# Patient Record
Sex: Female | Born: 1965 | Race: White | Hispanic: No | Marital: Married | Smoking: Never smoker
Health system: Southern US, Community
[De-identification: ages and names within clinical notes are randomized; demographics above are authoritative.]

## PROBLEM LIST (undated history)

## (undated) DIAGNOSIS — E039 Hypothyroidism, unspecified: Secondary | ICD-10-CM

## (undated) DIAGNOSIS — E079 Disorder of thyroid, unspecified: Secondary | ICD-10-CM

## (undated) DIAGNOSIS — C50919 Malignant neoplasm of unspecified site of unspecified female breast: Secondary | ICD-10-CM

## (undated) DIAGNOSIS — K5790 Diverticulosis of intestine, part unspecified, without perforation or abscess without bleeding: Secondary | ICD-10-CM

## (undated) HISTORY — PX: APPENDECTOMY: SHX54

## (undated) HISTORY — DX: Malignant neoplasm of unspecified site of unspecified female breast: C50.919

---

## 1994-08-17 HISTORY — PX: ECTOPIC PREGNANCY SURGERY: SHX613

## 2012-12-19 ENCOUNTER — Encounter (HOSPITAL_COMMUNITY): Payer: Self-pay

## 2012-12-19 ENCOUNTER — Emergency Department (HOSPITAL_COMMUNITY)
Admission: EM | Admit: 2012-12-19 | Discharge: 2012-12-19 | Disposition: A | Discharge status: Home / Self Care | Payer: 59 | Source: Home / Self Care | Attending: Emergency Medicine | Admitting: Emergency Medicine

## 2012-12-19 DIAGNOSIS — J209 Acute bronchitis, unspecified: Secondary | ICD-10-CM

## 2012-12-19 MED ORDER — PREDNISONE 20 MG PO TABS: 20.0000 mg | ORAL_TABLET | Freq: Two times a day (BID) | ORAL | Status: DC

## 2012-12-19 MED ORDER — HYDROCOD POLST-CHLORPHEN POLST 10-8 MG/5ML PO LQCR: 5.0000 mL | Freq: Two times a day (BID) | ORAL | Status: DC | PRN

## 2012-12-19 MED ORDER — AMOXICILLIN-POT CLAVULANATE 875-125 MG PO TABS: 1.0000 | ORAL_TABLET | Freq: Two times a day (BID) | ORAL | Status: DC

## 2012-12-19 MED ORDER — FLUCONAZOLE 150 MG PO TABS
150.0000 mg | ORAL_TABLET | Freq: Once | ORAL | Status: DC
Start: 2012-12-19 — End: 2016-09-10

## 2012-12-19 MED ORDER — ALBUTEROL SULFATE HFA 108 (90 BASE) MCG/ACT IN AERS: 1.0000 | INHALATION_SPRAY | Freq: Four times a day (QID) | RESPIRATORY_TRACT | Status: DC | PRN

## 2012-12-19 NOTE — ED Notes (Signed)
Pt c/o 5 day duration of cough ,congestion; c/o unable to get any relief w OTC treatment, steam room

## 2012-12-19 NOTE — ED Provider Notes (Signed)
Chief Complaint:   Chief Complaint  Patient presents with  . Cough    History of Present Illness:   Colleen Montgomery is a 47 year old female with a five-day history of chest tightness, dry cough, nasal congestion, headache, sinus pressure, ear congestion, postnasal drip, hoarseness, and scratchy throat. She denies any fever, chills, wheezing, shortness of breath, chest pain, sore throat, or GI symptoms. She has no known sick exposures.  Review of Systems:  Other than noted above, the patient denies any of the following symptoms: Systemic:  No fevers, chills, sweats, weight loss or gain, fatigue, or tiredness. Eye:  No redness or discharge. ENT:  No ear pain, drainage, headache, nasal congestion, drainage, sinus pressure, difficulty swallowing, or sore throat. Neck:  No neck pain or swollen glands. Lungs:  No cough, sputum production, hemoptysis, wheezing, chest tightness, shortness of breath or chest pain. GI:  No abdominal pain, nausea, vomiting or diarrhea.  PMFSH:  Past medical history, family history, social history, meds, and allergies were reviewed. Her only medication is Levoxine for hypothyroidism.  Physical Exam:   Vital signs:  BP 119/80  Pulse 73  Temp(Src) 97.3 F (36.3 C) (Oral)  Resp 16  SpO2 97% General:  Alert and oriented.  In no distress.  Skin warm and dry. Eye:  No conjunctival injection or drainage. Lids were normal. ENT:  TMs and canals were normal, without erythema or inflammation.  Nasal mucosa was clear and uncongested, without drainage.  Mucous membranes were moist.  Pharynx was clear with no exudate or drainage.  There were no oral ulcerations or lesions. Neck:  Supple, no adenopathy, tenderness or mass. Lungs:  No respiratory distress.  Lungs were clear to auscultation, without wheezes, rales or rhonchi.  Breath sounds were clear and equal bilaterally.  Heart:  Regular rhythm, without gallops, murmers or rubs. Skin:  Clear, warm, and dry, without rash or  lesions.  Assessment:  The encounter diagnosis was Acute bronchitis.  Plan:   1.  The following meds were prescribed:   Discharge Medication List as of 12/19/2012  4:41 PM    START taking these medications   Details  albuterol (PROVENTIL HFA;VENTOLIN HFA) 108 (90 BASE) MCG/ACT inhaler Inhale 1-2 puffs into the lungs every 6 (six) hours as needed for wheezing., Starting 12/19/2012, Until Discontinued, Normal    amoxicillin-clavulanate (AUGMENTIN) 875-125 MG per tablet Take 1 tablet by mouth 2 (two) times daily., Starting 12/19/2012, Until Discontinued, Print    chlorpheniramine-HYDROcodone (TUSSIONEX) 10-8 MG/5ML LQCR Take 5 mLs by mouth every 12 (twelve) hours as needed., Starting 12/19/2012, Until Discontinued, Normal    fluconazole (DIFLUCAN) 150 MG tablet Take 1 tablet (150 mg total) by mouth once., Starting 12/19/2012, Print    predniSONE (DELTASONE) 20 MG tablet Take 1 tablet (20 mg total) by mouth 2 (two) times daily., Starting 12/19/2012, Until Discontinued, Normal       The patient was told not to get the prescription for antibiotic filled unless her respiratory symptoms had persisted for more than 7 to 10 days.  2.  The patient was instructed in symptomatic care and handouts were given. 3.  The patient was told to return if becoming worse in any way, if no better in 7 days, and given some red flag symptoms such as fever, worsening pain, or difficulty breathing that would indicate earlier return. 4.  Follow up here in one week if no improvement.      Reuben Likes, MD 12/19/12 2133

## 2016-09-10 ENCOUNTER — Encounter: Payer: Self-pay | Admitting: Family Medicine

## 2016-09-10 ENCOUNTER — Ambulatory Visit (INDEPENDENT_AMBULATORY_CARE_PROVIDER_SITE_OTHER): Payer: BLUE CROSS/BLUE SHIELD | Admitting: Family Medicine

## 2016-09-10 DIAGNOSIS — M533 Sacrococcygeal disorders, not elsewhere classified: Secondary | ICD-10-CM

## 2016-09-10 DIAGNOSIS — M999 Biomechanical lesion, unspecified: Secondary | ICD-10-CM

## 2016-09-10 DIAGNOSIS — M940 Chondrocostal junction syndrome [Tietze]: Secondary | ICD-10-CM

## 2016-09-10 NOTE — Progress Notes (Signed)
[facility] [address], [place] [address] Phone: [phone] Subjective:     CC: Neck and back pain  [id number]  Colleen Montgomery is a 51 y.o. female coming in with complaint of neck and back pain. Patient states that this is been a chronic problem. Has had intermittent pain since he was a child. Recently has been studying a lot more and sitting. Patient used to see a chiropractor but not having as much benefit. Patient tries to work out regularly but has not been doing so much. Patient does also work full time and is on her feet a significant amount. Patient denies any significant radiation or any numbness of any of the extremities. An states that it seems to be more on the left side of her scapula, right side of the neck, as well as left-sided lower back. Patient rates the severity of pain sometimes a 6 out of 10. Can stop her from doing more activities and her daily activities. Never stops her from daily activities. Does respond to ice as well as ibuprofen when needed.     No past medical history on file. No past surgical history on file. Social History   Social History  . Marital status: Married    Spouse name: N/A  . Number of children: N/A  . Years of education: N/A   Social History Main Topics  . Smoking status: Never Smoker  . Smokeless tobacco: Never Used  . Alcohol use None  . Drug use: Unknown  . Sexual activity: Not Asked   Other Topics Concern  . None   Social History Narrative  . None   Not on File No family history on file.  Past medical history, social, surgical and family history all reviewed in electronic medical record.  No pertanent information unless stated regarding to the chief complaint.   Review of Systems:Review of systems updated and as accurate as of 09/10/16  No headache, visual changes, nausea, vomiting, diarrhea, constipation, dizziness, abdominal pain, skin rash, fevers, chills, night sweats,  weight loss, swollen lymph nodes, body aches, joint swelling, muscle aches, chest pain, shortness of breath, mood changes.   Objective  Blood pressure 118/80, pulse 65, height 5\' 6"  (1.676 m), weight 166 lb (75.3 kg), SpO2 99 %. Systems examined below as of 09/10/16   General: No apparent distress alert and oriented x3 mood and affect normal, dressed appropriately.  HEENT: Pupils equal, extraocular movements intact  Respiratory: Patient's speak in full sentences and does not appear short of breath  Cardiovascular: No lower extremity edema, non tender, no erythema  Skin: Warm dry intact with no signs of infection or rash on extremities or on axial skeleton.  Abdomen: Soft nontender  Neuro: Cranial nerves II through XII are intact, neurovascularly intact in all extremities with 2+ DTRs and 2+ pulses.  Lymph: No lymphadenopathy of posterior or anterior cervical chain or axillae bilaterally.  Gait normal with good balance and coordination.  MSK:  Non tender with full range of motion and good stability and symmetric strength and tone of shoulders, elbows, wrist, hip, knee and ankles bilaterally.  Neck: Inspection unremarkable. No palpable stepoffs. Negative Spurling's maneuver. Mild limitation and right-sided side bending and rotation. Grip strength and sensation normal in bilateral hands Strength good C4 to T1 distribution No sensory change to C4 to T1 Negative Hoffman sign bilaterally Reflexes normal  Back Exam:  Inspection: Unremarkable  Motion: Flexion 40 deg, Extension 25 deg, Side Bending to 35 deg  bilaterally,  Rotation to 45 deg bilaterally  SLR laying: Negative  XSLR laying: Negative  Palpable tenderness: Tender to palpation in the paraspinal musculature of the lumbar spine mostly at the thoracolumbar juncture on the right side.Marland Kitchen FABER: Positive left. Sensory change: Gross sensation intact to all lumbar and sacral dermatomes.  Reflexes: 2+ at both patellar tendons, 2+ at  achilles tendons, Babinski's downgoing.  Strength at foot  Plantar-flexion: 5/5 Dorsi-flexion: 5/5 Eversion: 5/5 Inversion: 5/5  Leg strength  Quad: 5/5 Hamstring: 5/5 Hip flexor: 5/5 Hip abductors: 5/5  Gait unremarkable.  Osteopathic findings Cervical C2 flexed rotated and side bent right C4 flexed rotated and side bent left C6 flexed rotated and side bent right T3 extended rotated and side bent left inhaled third rib T9 extended rotated and side bent left L2 flexed rotated and side bent right Sacrum right on right  Procedure note 97110; 15 minutes spent for Therapeutic exercises as stated in above notes.  This included exercises focusing on stretching, strengthening, with significant focus on eccentric aspects. Sacroiliac Joint Mobilization and Rehab 1. Work on pretzel stretching, shoulder back and leg draped in front. 3-5 sets, 30 sec.. 2. hip abductor rotations. standing, hip flexion and rotation outward then inward. 3 sets, 15 reps. when can do comfortably, add ankle weights starting at 2 pounds.  3. cross over stretching - shoulder back to ground, same side leg crossover. 3-5 sets for 30 min..  4. rolling up and back knees to chest and rocking. 5. sacral tilt - 5 sets, hold for 5-10 seconds   Proper technique shown and discussed handout in great detail with ATC.  All questions were discussed and answered.     Impression and Recommendations:     This case required medical decision making of moderate complexity.      Note: This dictation was prepared with Dragon dictation along with smaller phrase technology. Any transcriptional errors that result from this process are unintentional.

## 2016-09-10 NOTE — Assessment & Plan Note (Signed)
Decision today to treat with OMT was based on Physical Exam  After verbal consent patient was treated with HVLA, ME, FPR techniques in cervical, thoracic, rib, lumbar and sacral areas  Patient tolerated the procedure well with improvement in symptoms  Patient given exercises, stretches and lifestyle modifications  See medications in patient instructions if given  Patient will follow up in 3-4 weeks 

## 2016-09-10 NOTE — Assessment & Plan Note (Signed)
Discussed with patient at great length. I do think that this is secondary to patient's sitting more frequent, poor posture, as well as large breast tissue. Discussed with patient about home exercises. Work with Product/process development scientist to learn home exercises in greater detail. Discussed ergonomics at work as well as a Pensions consultant. Follow-up again with me in 3-4 weeks.

## 2016-09-10 NOTE — Patient Instructions (Signed)
Good to see you  Alvera Singh is your friend.  Stay active You will do great with school! Tennis ball between shoulder blades with sitting When you feel tightness at base of the skull, put 2 tennis ball in a tube sock and place at base of skull and lay on them.  Vitamin D 2000 IU daily  Turmeric 500mg  twice daily  See me again in 3-4 weeks.

## 2016-09-24 ENCOUNTER — Encounter: Payer: Self-pay | Admitting: Family Medicine

## 2016-09-24 ENCOUNTER — Ambulatory Visit (INDEPENDENT_AMBULATORY_CARE_PROVIDER_SITE_OTHER): Payer: BLUE CROSS/BLUE SHIELD | Admitting: Family Medicine

## 2016-09-24 VITALS — BP 124/80 | HR 79 | Ht 66.0 in | Wt 165.0 lb

## 2016-09-24 DIAGNOSIS — M999 Biomechanical lesion, unspecified: Secondary | ICD-10-CM

## 2016-09-24 DIAGNOSIS — M533 Sacrococcygeal disorders, not elsewhere classified: Secondary | ICD-10-CM

## 2016-09-24 DIAGNOSIS — M940 Chondrocostal junction syndrome [Tietze]: Secondary | ICD-10-CM

## 2016-09-24 NOTE — Assessment & Plan Note (Signed)
Encouraged patient to increase activity discussed with patient about core strengthening and stability and hip abductor strengthening. Follow-up 6-8 weeks

## 2016-09-24 NOTE — Assessment & Plan Note (Signed)
Decision today to treat with OMT was based on Physical Exam  After verbal consent patient was treated with HVLA, ME, FPR techniques in cervical, thoracic, rib, lumbar and sacral areas  Patient tolerated the procedure well with improvement in symptoms  Patient given exercises, stretches and lifestyle modifications  See medications in patient instructions if given  Patient will follow up in 6 weeks 

## 2016-09-24 NOTE — Assessment & Plan Note (Signed)
Stable. Did have another slipped rib. Responded well to manipulation. We discussed icing regimen and home exercises. We discussed objective is to do a which was to avoid. Patient will stay active. Appointmentsto6weekintervals.

## 2016-09-24 NOTE — Progress Notes (Signed)
[facility] [address] [place], [place] [address] Phone: [phone] Subjective:     CC: Neck and back pain f/u  [id number]  Colleen Montgomery is a 51 y.o. female coming in with complaint of neck and back pain. 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 We discussed ergonomics. Responded well to manipulation. Patient has been doing the home exercises, over-the-counter medications, as well as icing protocol. Feels like she is doing to to 60% better. Making significant benefit. Happy with the results. Staying very active.     No past medical history on file. No past surgical history on file. Social History   Social History  . Marital status: Married    Spouse name: N/A  . Number of children: N/A  . Years of education: N/A   Social History Main Topics  . Smoking status: Never Smoker  . Smokeless tobacco: Never Used  . Alcohol use None  . Drug use: Unknown  . Sexual activity: Not Asked   Other Topics Concern  . None   Social History Narrative  . None   Not on File no known drug allergies No family history on file. No family history rheumatological diseases  Past medical history, social, surgical and family history all reviewed in electronic medical record.  No pertanent information unless stated regarding to the chief complaint.   Review of Systems: No headache, visual changes, nausea, vomiting, diarrhea, constipation, dizziness, abdominal pain, skin rash, fevers, chills, night sweats, weight loss, swollen lymph nodes, body aches, joint swelling, muscle aches, chest pain, shortness of breath, mood changes.    Objective  Blood pressure 124/80, pulse 79, height 5\' 6"  (1.676 m), weight 165 lb (74.8 kg), SpO2 99 %. Systems examined below as of 09/24/16   Systems examined below as of 09/24/16 General: NAD A&O x3 mood, affect normal  HEENT: Pupils equal, extraocular movements  intact no nystagmus Respiratory: not short of breath at rest or with speaking Cardiovascular: No lower extremity edema, non tender Skin: Warm dry intact with no signs of infection or rash on extremities or on axial skeleton. Abdomen: Soft nontender, no masses Neuro: Cranial nerves  intact, neurovascularly intact in all extremities with 2+ DTRs and 2+ pulses. Lymph: No lymphadenopathy appreciated today  Gait normal with good balance and coordination.  MSK: Non tender with full range of motion and good stability and symmetric strength and tone of shoulders, elbows, wrist,  knee hips and ankles bilaterally.   Neck: Inspection unremarkable. No palpable stepoffs. Negative Spurling's maneuver. Very minimal Limited right-sided rotation. Mild improvement from previous exam Grip strength and sensation normal in bilateral hands Strength good C4 to T1 distribution No sensory change to C4 to T1 Negative Hoffman sign bilaterally Reflexes normal  Back Exam:  Inspection: Unremarkable  Motion: Flexion 45 deg, Extension 15 deg, Side Bending to 45 deg bilaterally,  Rotation to 45 deg bilaterally  SLR laying: Negative  XSLR laying: Negative  Palpable tenderness: Tender to palpation in the paraspinal musculature of the lumbar spine mostly on the left side.Marland Kitchen FABER: Still positive left. Sensory change: Gross sensation intact to all lumbar and sacral dermatomes.  Reflexes: 2+ at both patellar tendons, 2+ at achilles tendons, Babinski's downgoing.  Strength at foot  Plantar-flexion: 5/5 Dorsi-flexion: 5/5 Eversion: 5/5 Inversion: 5/5  Leg strength  Quad: 5/5 Hamstring: 5/5 Hip flexor: 5/5 Hip abductors: 5/5  Gait unremarkable.   Osteopathic findings Cervical C2 flexed rotated  and side bent right C7 flexed rotated and side bent left T3 extended rotated and side bent right inhaled third rib T7 extended rotated and side bent left L2 flexed rotated and side bent right Sacrum right on right       Impression and Recommendations:     This case required medical decision making of moderate complexity.      Note: This dictation was prepared with Dragon dictation along with smaller phrase technology. Any transcriptional errors that result from this process are unintentional.

## 2016-09-24 NOTE — Patient Instructions (Signed)
Good to see you  I am impressed You are doing great  See me again in 6 weeks.

## 2016-11-04 NOTE — Progress Notes (Signed)
[facility] [address], [place] [address] Phone: [phone] Subjective:     CC: Neck and back pain f/u  [id number]  Colleen Montgomery is a 51 y.o. female coming in with complaint of neck and back pain. 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 We discussed ergonomics. Patient continues to do well with conservative therapy. Has responded well to last about a manipulation previously. Patient describes the pain as a dull throbbing aching sensation more of the left sacroiliac joint but not as much pain in the neck. Patient has started working on a more regular basis and feels like it has made some significant improvement as well.    No past medical history on file. No past surgical history on file. Social History   Social History  . Marital status: Married    Spouse name: N/A  . Number of children: N/A  . Years of education: N/A   Social History Main Topics  . Smoking status: Never Smoker  . Smokeless tobacco: Never Used  . Alcohol use None  . Drug use: Unknown  . Sexual activity: Not Asked   Other Topics Concern  . None   Social History Narrative  . None   No Known Allergies no known drug allergies No family history on file. No family history rheumatological diseases  Past medical history, social, surgical and family history all reviewed in electronic medical record.  No pertanent information unless stated regarding to the chief complaint.   Review of Systems: No headache, visual changes, nausea, vomiting, diarrhea, constipation, dizziness, abdominal pain, skin rash, fevers, chills, night sweats, weight loss, swollen lymph nodes, body aches, joint swelling,  chest pain, shortness of breath, mood changes.   Positive muscle aches  Objective  Blood pressure 122/76, pulse 78, height 5\' 6"  (1.676 m), weight 167 lb 6.4 oz (75.9 kg), SpO2 98 %.   Systems examined below as  of 11/05/16 General: NAD A&O x3 mood, affect normal  HEENT: Pupils equal, extraocular movements intact no nystagmus Respiratory: not short of breath at rest or with speaking Cardiovascular: No lower extremity edema, non tender Skin: Warm dry intact with no signs of infection or rash on extremities or on axial skeleton. Abdomen: Soft nontender, no masses Neuro: Cranial nerves  intact, neurovascularly intact in all extremities with 2+ DTRs and 2+ pulses. Lymph: No lymphadenopathy appreciated today  Gait normal with good balance and coordination.  MSK: Non tender with full range of motion and good stability and symmetric strength and tone of shoulders, elbows, wrist,  knee hips and ankles bilaterally.   Neck: Inspection unremarkable. No palpable stepoffs. Negative Spurling's maneuver. Mild limitation lacking the last 5 of side bending and rotation bilaterally right greater than left Grip strength and sensation normal in bilateral hands Strength good C4 to T1 distribution No sensory change to C4 to T1 Negative Hoffman sign bilaterally Reflexes normal  Back Exam:  Inspection: Unremarkable  Motion: Flexion 45 deg, Extension 15 deg, Side Bending to 45 deg bilaterally,  Rotation to 45 deg bilaterally  SLR laying: Negative  XSLR laying: Negative  Palpable tenderness: Tenderness now seems to be localized around the left sacroiliac joint.Marland Kitchen FABER: Still positive left. Sensory change: Gross sensation intact to all lumbar and sacral dermatomes.  Reflexes: 2+ at both patellar tendons, 2+ at achilles tendons, Babinski's downgoing.  Strength at foot  Plantar-flexion: 5/5 Dorsi-flexion: 5/5 Eversion: 5/5 Inversion: 5/5  Leg strength  Quad: 5/5 Hamstring: 5/5 Hip flexor: 5/5 Hip abductors: 5/5  Gait unremarkable.   Osteopathic findings Cervical C2 flexed rotated and side bent right C5 flexed rotated and side bent left C7 flexed rotated and side bent left T3 extended rotated and side bent  right inhaled third rib T8 extended rotated and side bent left L2 flexed rotated and side bent right Sacrum right on right Pelvic shear.      Impression and Recommendations:     This case required medical decision making of moderate complexity.      Note: This dictation was prepared with Dragon dictation along with smaller phrase technology. Any transcriptional errors that result from this process are unintentional.

## 2016-11-05 ENCOUNTER — Encounter: Payer: Self-pay | Admitting: Family Medicine

## 2016-11-05 ENCOUNTER — Ambulatory Visit (INDEPENDENT_AMBULATORY_CARE_PROVIDER_SITE_OTHER): Payer: BLUE CROSS/BLUE SHIELD | Admitting: Family Medicine

## 2016-11-05 VITALS — BP 122/76 | HR 78 | Ht 66.0 in | Wt 167.4 lb

## 2016-11-05 DIAGNOSIS — M533 Sacrococcygeal disorders, not elsewhere classified: Secondary | ICD-10-CM | POA: Diagnosis not present

## 2016-11-05 DIAGNOSIS — M999 Biomechanical lesion, unspecified: Secondary | ICD-10-CM | POA: Diagnosis not present

## 2016-11-05 NOTE — Assessment & Plan Note (Addendum)
Decision today to treat with OMT was based on Physical Exam  After verbal consent patient was treated with HVLA, ME, FPR techniques in cervical, thoracic, rib lumbar and sacral areas  Patient tolerated the procedure well with improvement in symptoms  Patient given exercises, stretches and lifestyle modifications  See medications in patient instructions if given  Patient will follow up in 6-8 weeks 

## 2016-11-05 NOTE — Assessment & Plan Note (Signed)
Continues to have discomfort in the area. We discussed icing regimen and home exercises. Encourage patient to continue to work on core strengthening instability. We discussed objective recently do a which ones to avoid. Follow-up again in 6-8 weeks.

## 2016-11-05 NOTE — Patient Instructions (Signed)
Good to see you  You are doing great overall  [place] for you  Keep it up  Ato Zen with Janett Billow or kneaded energy  Try to log on to my chart and can make appointment that way See me again in 6-8 weeks

## 2017-01-04 NOTE — Progress Notes (Signed)
[facility] [place], [place] [address] Phone: [phone] Subjective:     CC: Neck and back pain f/u  [id number]  Colleen Montgomery is a 51 y.o. female coming in with complaint of neck and back pain. 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 Patient has been doing relatively better. Patient though unfortunately has had some increasing stiffness of the upper back in the lower back secondary to patient studying.  Patient states no new symptoms just worsening of symptoms.     No past medical history on file. No past surgical history on file. Social History   Social History  . Marital status: Married    Spouse name: N/A  . Number of children: N/A  . Years of education: N/A   Social History Main Topics  . Smoking status: Never Smoker  . Smokeless tobacco: Never Used  . Alcohol use None  . Drug use: Unknown  . Sexual activity: Not Asked   Other Topics Concern  . None   Social History Narrative  . None   No Known Allergies no known drug allergies No family history on file. No family history rheumatological diseases  Past medical history, social, surgical and family history all reviewed in electronic medical record.  No pertanent information unless stated regarding to the chief complaint.   Review of Systems: No headache, visual changes, nausea, vomiting, diarrhea, constipation, dizziness, abdominal pain, skin rash, fevers, chills, night sweats, weight loss, swollen lymph nodes, body aches, joint swelling, muscle aches, chest pain, shortness of breath, mood changes.    Objective  Blood pressure 102/70, pulse 73, height 5\' 6"  (1.676 m), weight 167 lb (75.8 kg), SpO2 97 %.   Systems examined below as of 01/05/17 General: NAD A&O x3 mood, affect normal  HEENT: Pupils equal, extraocular movements intact no nystagmus Respiratory: not short of breath at rest or with  speaking Cardiovascular: No lower extremity edema, non tender Skin: Warm dry intact with no signs of infection or rash on extremities or on axial skeleton. Abdomen: Soft nontender, no masses Neuro: Cranial nerves  intact, neurovascularly intact in all extremities with 2+ DTRs and 2+ pulses. Lymph: No lymphadenopathy appreciated today  Gait normal with good balance and coordination.  MSK: Non tender with full range of motion and good stability and symmetric strength and tone of shoulders, elbows, wrist,  knee hips and ankles bilaterally.    Neck: Inspection unremarkable. No palpable stepoffs. Negative Spurling's maneuver. Mild pain with extension.  Grip strength and sensation normal in bilateral hands Strength good C4 to T1 distribution No sensory change to C4 to T1 Negative Hoffman sign bilaterally Reflexes normal  Back Exam:  Inspection: Unremarkable  Motion: Flexion 40 deg, Extension 25 deg, Side Bending to 45 deg bilaterally,  Rotation to 45 deg bilaterally  SLR laying: Negative  XSLR laying: Negative  Palpable tenderness: More discomfort over the sacroiliac joint on the left side. FABER: Positive left. Sensory change: Gross sensation intact to all lumbar and sacral dermatomes.  Reflexes: 2+ at both patellar tendons, 2+ at achilles tendons, Babinski's downgoing.  Strength at foot  Plantar-flexion: 5/5 Dorsi-flexion: 5/5 Eversion: 5/5 Inversion: 5/5  Leg strength  Quad: 5/5 Hamstring: 5/5 Hip flexor: 5/5 Hip abductors: 5/5  Gait unremarkable.    Osteopathic findings C2 flexed rotated and side bent right C5 flexed rotated and side bent left C7 flexed rotated and side bent right T3 extended rotated  and side bent right inhaled third rib T7 extended rotated and side bent left L3 flexed rotated and side bent right Sacrum right on right Pelvic shear noted with left-sided lower.      Impression and Recommendations:     This case required medical decision making of  moderate complexity.      Note: This dictation was prepared with Dragon dictation along with smaller phrase technology. Any transcriptional errors that result from this process are unintentional.

## 2017-01-05 ENCOUNTER — Ambulatory Visit (INDEPENDENT_AMBULATORY_CARE_PROVIDER_SITE_OTHER): Payer: BLUE CROSS/BLUE SHIELD | Admitting: Family Medicine

## 2017-01-05 ENCOUNTER — Encounter: Payer: Self-pay | Admitting: Family Medicine

## 2017-01-05 VITALS — BP 102/70 | HR 73 | Ht 66.0 in | Wt 167.0 lb

## 2017-01-05 DIAGNOSIS — M940 Chondrocostal junction syndrome [Tietze]: Secondary | ICD-10-CM | POA: Diagnosis not present

## 2017-01-05 DIAGNOSIS — M999 Biomechanical lesion, unspecified: Secondary | ICD-10-CM

## 2017-01-05 DIAGNOSIS — M533 Sacrococcygeal disorders, not elsewhere classified: Secondary | ICD-10-CM | POA: Diagnosis not present

## 2017-01-05 NOTE — Assessment & Plan Note (Signed)
Decision today to treat with OMT was based on Physical Exam  After verbal consent patient was treated with HVLA, ME, FPR techniques in cervical, thoracic, rib lumbar and sacral areas  Patient tolerated the procedure well with improvement in symptoms  Patient given exercises, stretches and lifestyle modifications  See medications in patient instructions if given  Patient will follow up in 4 weeks 

## 2017-01-05 NOTE — Assessment & Plan Note (Signed)
Seems to be somewhat more tender. Patient has been active. I do feel that stress is also playing a role. We will continue to monitor closely. Patient is taking very minimal medications at this time. Patient come back and see me again in 4 weeks.

## 2017-01-05 NOTE — Patient Instructions (Signed)
Congrats!! Ice is your friend.  Try more planks and heel ups for core Keep doing everything else See me again in 4 weeks.

## 2017-01-05 NOTE — Assessment & Plan Note (Signed)
Worsening symptoms. Patient has been doing fairly well overall. Patient though has been studying more. We discussed ergonomics. Possible standing desk. RTC 4 weeks

## 2017-01-27 ENCOUNTER — Ambulatory Visit (INDEPENDENT_AMBULATORY_CARE_PROVIDER_SITE_OTHER)
Admission: RE | Admit: 2017-01-27 | Discharge: 2017-01-27 | Disposition: A | Discharge status: Home / Self Care | Payer: BLUE CROSS/BLUE SHIELD | Source: Ambulatory Visit | Attending: Family Medicine | Admitting: Family Medicine

## 2017-01-27 ENCOUNTER — Ambulatory Visit (INDEPENDENT_AMBULATORY_CARE_PROVIDER_SITE_OTHER): Payer: BLUE CROSS/BLUE SHIELD | Admitting: Family Medicine

## 2017-01-27 ENCOUNTER — Encounter: Payer: Self-pay | Admitting: Family Medicine

## 2017-01-27 VITALS — BP 120/80 | HR 58 | Ht 66.0 in | Wt 162.0 lb

## 2017-01-27 DIAGNOSIS — M545 Low back pain, unspecified: Secondary | ICD-10-CM

## 2017-01-27 DIAGNOSIS — M533 Sacrococcygeal disorders, not elsewhere classified: Secondary | ICD-10-CM | POA: Diagnosis not present

## 2017-01-27 DIAGNOSIS — M999 Biomechanical lesion, unspecified: Secondary | ICD-10-CM

## 2017-01-27 NOTE — Assessment & Plan Note (Signed)
Decision today to treat with OMT was based on Physical Exam  After verbal consent patient was treated with HVLA, ME, FPR techniques in cervical, thoracic, rib lumbar and sacral areas  Patient tolerated the procedure well with improvement in symptoms  Patient given exercises, stretches and lifestyle modifications  See medications in patient instructions if given  Patient will follow up in 4-6 weeks 

## 2017-01-27 NOTE — Progress Notes (Signed)
[facility] [place], [place] [address] Phone: [phone] Subjective:     CC: Neck and back pain f/u  [id number]  Colleen Montgomery is a 51 y.o. female coming in with complaint of neck and back pain. 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 Patient was seen 3 weeks ago. Patient states that after the last few patient had some exacerbation. Patient states Has had worsening pain. Patient has had more neck as well as more of the sacroiliac pain. No radiation. But overall worsening.     No past medical history on file. No past surgical history on file. Social History   Social History  . Marital status: Married    Spouse name: N/A  . Number of children: N/A  . Years of education: N/A   Social History Main Topics  . Smoking status: Never Smoker  . Smokeless tobacco: Never Used  . Alcohol use None  . Drug use: Unknown  . Sexual activity: Not Asked   Other Topics Concern  . None   Social History Narrative  . None   No Known Allergies no known drug allergies No family history on file. No family history rheumatological diseases  Past medical history, social, surgical and family history all reviewed in electronic medical record.  No pertanent information unless stated regarding to the chief complaint.   Review of Systems: No headache, visual changes, nausea, vomiting, diarrhea, constipation, dizziness, abdominal pain, skin rash, fevers, chills, night sweats, weight loss, swollen lymph nodes, body aches, joint swelling, muscle aches, chest pain, shortness of breath, mood changes.    Objective  Blood pressure 120/80, pulse (!) 58, height 5\' 6"  (1.676 m), weight 162 lb (73.5 kg).   Systems examined below as of 01/27/17 General: NAD A&O x3 mood, affect normal  HEENT: Pupils equal, extraocular movements intact no nystagmus Respiratory: not short of breath at rest or with  speaking Cardiovascular: No lower extremity edema, non tender Skin: Warm dry intact with no signs of infection or rash on extremities or on axial skeleton. Abdomen: Soft nontender, no masses Neuro: Cranial nerves  intact, neurovascularly intact in all extremities with 2+ DTRs and 2+ pulses. Lymph: No lymphadenopathy appreciated today  Gait normal with good balance and coordination.  MSK: Non tender with full range of motion and good stability and symmetric strength and tone of shoulders, elbows, wrist,  knee hips and ankles bilaterally.    Neck: Inspection unremarkable. No palpable stepoffs. Negative Spurling's maneuver. Full neck range of motion Grip strength and sensation normal in bilateral hands Strength good C4 to T1 distribution No sensory change to C4 to T1 Negative Hoffman sign bilaterally Reflexes normal  Back Exam:  Inspection: Unremarkable  Motion: Flexion 45 deg, Extension 25 deg, Side Bending to 40 deg bilaterally,  Rotation to 40 deg bilaterally  SLR laying: Negative  XSLR laying: Negative  Palpable tenderness: Tender to palpation of the paraspinal muscles her lumbar spine more over the left sacroiliac joint. FABER: Positive left. Sensory change: Gross sensation intact to all lumbar and sacral dermatomes.  Reflexes: 2+ at both patellar tendons, 2+ at achilles tendons, Babinski's downgoing.  Strength at foot  Plantar-flexion: 5/5 Dorsi-flexion: 5/5 Eversion: 5/5 Inversion: 5/5  Leg strength  Quad: 5/5 Hamstring: 5/5 Hip flexor: 5/5 Hip abductors: 5/5  Gait unremarkable.    Osteopathic findings C2 flexed rotated and side bent right T3 extended rotated and side bent right inhaled  third rib T9 extended rotated and side bent left L2 flexed rotated and side bent right Sacrum right on right Pelvic shear on left     Impression and Recommendations:     This case required medical decision making of moderate complexity.      Note: This dictation was prepared  with Dragon dictation along with smaller phrase technology. Any transcriptional errors that result from this process are unintentional.

## 2017-01-27 NOTE — Patient Instructions (Addendum)
Sorry you are hurting.  Ice is your friend Xrays downstairs More with the but out and the belly button in  Avoid extension of the back  See you when you get back!

## 2017-01-27 NOTE — Progress Notes (Signed)
D

## 2017-01-27 NOTE — Assessment & Plan Note (Signed)
Patient continues to have difficulty. We discussed icing regimen and home exercises. Patient did have manipulation done again. X-rays ordered. Worsening symptoms we may need to consider other medications injection. Follow-up again in 2-4 weeks.

## 2017-02-26 ENCOUNTER — Encounter: Payer: Self-pay | Admitting: Family Medicine

## 2017-02-26 ENCOUNTER — Ambulatory Visit (INDEPENDENT_AMBULATORY_CARE_PROVIDER_SITE_OTHER): Payer: BLUE CROSS/BLUE SHIELD | Admitting: Family Medicine

## 2017-02-26 VITALS — BP 108/78 | HR 74 | Ht 66.0 in | Wt 161.0 lb

## 2017-02-26 DIAGNOSIS — M999 Biomechanical lesion, unspecified: Secondary | ICD-10-CM

## 2017-02-26 DIAGNOSIS — M533 Sacrococcygeal disorders, not elsewhere classified: Secondary | ICD-10-CM

## 2017-02-26 NOTE — Progress Notes (Signed)
Pre visit review using our clinic review tool, if applicable. No additional management support is needed unless otherwise documented below in the visit note. 

## 2017-02-26 NOTE — Progress Notes (Signed)
[facility] [place], [place] [address] Phone: [phone] Subjective:     CC: Neck and back pain f/u  [id number]  Colleen Montgomery is a 51 y.o. female coming in with complaint of neck and back pain. 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 Patient has been doing the home exercises, icing regimen, which activities doing which ones to avoid. Patient has tried this. Not doing her Pilates on a regular basis. Patient denies any radiation of pain. Has responded osteopathic manipulation of the past. Nothing that is stopping her from activity. Has noticed with her not doing the Pilates the pain seemed to improve.    Patient did have x-rays of lumbar spine taken 01/27/2017. These were independently visualized by me showing no significant bony normality.  No past medical history on file. No past surgical history on file. Social History   Social History  . Marital status: Married    Spouse name: N/A  . Number of children: N/A  . Years of education: N/A   Social History Main Topics  . Smoking status: Never Smoker  . Smokeless tobacco: Never Used  . Alcohol use Not on file  . Drug use: Unknown  . Sexual activity: Not on file   Other Topics Concern  . Not on file   Social History Narrative  . No narrative on file   No Known Allergies no known drug allergies No family history on file. No family history rheumatological diseases  Past medical history, social, surgical and family history all reviewed in electronic medical record.  No pertanent information unless stated regarding to the chief complaint.   Review of Systems: No headache, visual changes, nausea, vomiting, diarrhea, constipation, dizziness, abdominal pain, skin rash, fevers, chills, night sweats, weight loss, swollen lymph nodes, body aches, joint swelling, muscle aches, chest pain, shortness of breath, mood changes.     Objective   Height 5\' 6"  (1.676 m), weight 161 lb (73 kg).   Systems examined below as of 02/26/17 General: NAD A&O x3 mood, affect normal  HEENT: Pupils equal, extraocular movements intact no nystagmus Respiratory: not short of breath at rest or with speaking Cardiovascular: No lower extremity edema, non tender Skin: Warm dry intact with no signs of infection or rash on extremities or on axial skeleton. Abdomen: Soft nontender, no masses Neuro: Cranial nerves  intact, neurovascularly intact in all extremities with 2+ DTRs and 2+ pulses. Lymph: No lymphadenopathy appreciated today  Gait normal with good balance and coordination.  MSK: Non tender with full range of motion and good stability and symmetric strength and tone of shoulders, elbows, wrist,  knee hips and ankles bilaterally.   Neck: Inspection unremarkable. No palpable stepoffs. Negative Spurling's maneuver. Full neck range of motion Grip strength and sensation normal in bilateral hands Strength good C4 to T1 distribution No sensory change to C4 to T1 Negative Hoffman sign bilaterally Reflexes normal Back Exam:  Inspection: Unremarkable  Motion: Flexion 40 deg, Extension 25 deg, Side Bending to 25 deg bilaterally,  Rotation to 35 deg bilaterally  SLR laying: Negative  XSLR laying: Negative  Palpable tenderness: Tender to palpation the paraspinal [place] trauma lumbar spine (right. More over the sacral iliac joint. FABER: Positive left Sensory change: Gross sensation intact to all lumbar and sacral dermatomes.  Reflexes: 2+ at both patellar tendons, 2+ at achilles tendons, Babinski's downgoing.  Strength at foot  Plantar-flexion: 5/5 Dorsi-flexion: 5/5 Eversion: 5/5 Inversion: 5/5  Leg strength  Quad: 5/5 Hamstring: 5/5 Hip flexor: 5/5 Hip abductors: 5/5  Gait unremarkable.    Osteopathic findings C2 flexed rotated and side bent left C7 flexed rotated and side bent left T3 extended rotated and side bent right inhaled third  rib T9 extended rotated and side bent right L2 flexed rotated and side bent right Sacrum right on right     Impression and Recommendations:     This case required medical decision making of moderate complexity.      Note: This dictation was prepared with Dragon dictation along with smaller phrase technology. Any transcriptional errors that result from this process are unintentional.

## 2017-02-26 NOTE — Assessment & Plan Note (Signed)
Decision today to treat with OMT was based on Physical Exam  After verbal consent patient was treated with HVLA, ME, FPR techniques in cervical, thoracic, rib, lumbar and sacral areas  Patient tolerated the procedure well with improvement in symptoms  Patient given exercises, stretches and lifestyle modifications  See medications in patient instructions if given  Patient will follow up in 4-6 weeks 

## 2017-02-26 NOTE — Patient Instructions (Addendum)
Great to see you  Colleen Montgomery is still your friend.  Ato Zen would be good and I think Anderson Malta is the one.  See me again in 4 weeks!

## 2017-02-26 NOTE — Assessment & Plan Note (Signed)
Stable. Still having pain over the left sign. We did discuss the possibility of injection. Patient declined. We discussed icing regimen, home exercises, which activities to do in which ones to avoid. Patient will start increasing activity as tolerated. Follow-up with me again in 4-6 weeks.

## 2017-04-27 NOTE — Progress Notes (Signed)
[facility] [place], [place] [address] Phone: [phone] Subjective:    I'm seeing this patient by the request  of:    CC: SI joint pain, upper back pain  [id number]  Albert Abbett-Lawhorn is a 51 y.o. female coming in with complaint of back/sacral pain. She states that her thoracic area is tight. She notes that most of her pain is on the left sacral area. She also notes that her thoracic spine feels tight. She has been doing pure barre but has been fine during that activity.   Onset- chronic Location- Hip/ Sacral Duration- intermittent Character- 8 out of 10 Aggravating factors- supine to sidelying movements Reliving factors- Patient seems to not be quite as active it seems to sometimes help and sometimes hurts Therapies tried- ice and ibuprofen Severity- 5 out of 10     No past medical history on file. No past surgical history on file. Social History   Social History  . Marital status: Married    Spouse name: N/A  . Number of children: N/A  . Years of education: N/A   Social History Main Topics  . Smoking status: Never Smoker  . Smokeless tobacco: Never Used  . Alcohol use Not on file  . Drug use: Unknown  . Sexual activity: Not on file   Other Topics Concern  . Not on file   Social History Narrative  . No narrative on file   No Known Allergies No family history on file.   Past medical history, social, surgical and family history all reviewed in electronic medical record.  No pertanent information unless stated regarding to the chief complaint.   Review of Systems:Review of systems updated and as accurate as of 04/27/17  No headache, visual changes, nausea, vomiting, diarrhea, constipation, dizziness, abdominal pain, skin rash, fevers, chills, night sweats, weight loss, swollen lymph nodes, body aches, joint swelling, muscle aches, chest pain, shortness of breath, mood changes.   Objective  There were no vitals  taken for this visit. Systems examined below as of 04/27/17   General: No apparent distress alert and oriented x3 mood and affect normal, dressed appropriately.  HEENT: Pupils equal, extraocular movements intact  Respiratory: Patient's speak in full sentences and does not appear short of breath  Cardiovascular: No lower extremity edema, non tender, no erythema  Skin: Warm dry intact with no signs of infection or rash on extremities or on axial skeleton.  Abdomen: Soft nontender  Neuro: Cranial nerves II through XII are intact, neurovascularly intact in all extremities with 2+ DTRs and 2+ pulses.  Lymph: No lymphadenopathy of posterior or anterior cervical chain or axillae bilaterally.  Gait normal with good balance and coordination.  MSK:  Non tender with full range of motion and good stability and symmetric strength and tone of shoulders, elbows, wrist, hip, knee and ankles bilaterally.  Neck: Inspection unremarkable. No palpable stepoffs. Negative Spurling's maneuver. Full neck range of motion Grip strength and sensation normal in bilateral hands Strength good C4 to T1 distribution No sensory change to C4 to T1 Negative Hoffman sign bilaterally Reflexes normal Tight right trapezius noted  Back exam shows the patient does have severe tightness and tenderness over the left sacroiliac joint. Positive Faber test. Minimal pain in the piriformis. Negative straight leg test. More pain with extension of the back  Procedure: Real-time Ultrasound Guided Injection of left sacroiliac joint Device: GE Logiq Q7 Ultrasound guided injection is preferred based studies that show increased duration,  increased effect, greater accuracy, decreased procedural pain, increased response rate, and decreased cost with ultrasound guided versus blind injection.  Verbal informed consent obtained.  Time-out conducted.  Noted no overlying erythema, induration, or other signs of local infection.  Skin prepped in a  sterile fashion.  Local anesthesia: Topical Ethyl chloride.  With sterile technique and under real time ultrasound guidance: With a 21-gauge 2 inch no patient was injected with a total of 0.5 mL of 0.5% Marcaine and 1 mL of Kenalog 41 g/dL and the left sacroiliac joint  Completed without difficulty  Pain immediately resolved suggesting accurate placement of the medication.  Advised to call if fevers/chills, erythema, induration, drainage, or persistent bleeding.  Images permanently stored and available for review in the ultrasound unit.  Impression: Technically successful ultrasound guided injection.  Osteopathic findings  C2 flexed rotated and side bent right C4 flexed rotated and side bent left C7 flexed rotated and side bent left T3 extended rotated and side bent right inhaled third rib T6 extended rotated and side bent left L2 flexed rotated and side bent right Sacrum right on right    Impression and Recommendations:     This case required medical decision making of moderate complexity.      Note: This dictation was prepared with Dragon dictation along with smaller phrase technology. Any transcriptional errors that result from this process are unintentional.

## 2017-04-28 ENCOUNTER — Encounter: Payer: Self-pay | Admitting: Family Medicine

## 2017-04-28 ENCOUNTER — Ambulatory Visit: Payer: Self-pay

## 2017-04-28 ENCOUNTER — Ambulatory Visit (INDEPENDENT_AMBULATORY_CARE_PROVIDER_SITE_OTHER): Payer: BLUE CROSS/BLUE SHIELD | Admitting: Family Medicine

## 2017-04-28 VITALS — BP 120/72 | HR 74 | Ht 66.0 in | Wt 168.0 lb

## 2017-04-28 DIAGNOSIS — G8929 Other chronic pain: Secondary | ICD-10-CM | POA: Diagnosis not present

## 2017-04-28 DIAGNOSIS — M940 Chondrocostal junction syndrome [Tietze]: Secondary | ICD-10-CM | POA: Diagnosis not present

## 2017-04-28 DIAGNOSIS — M533 Sacrococcygeal disorders, not elsewhere classified: Principal | ICD-10-CM

## 2017-04-28 DIAGNOSIS — M999 Biomechanical lesion, unspecified: Secondary | ICD-10-CM | POA: Diagnosis not present

## 2017-04-28 NOTE — Assessment & Plan Note (Signed)
Decision today to treat with OMT was based on Physical Exam  After verbal consent patient was treated with HVLA, ME, FPR techniques in cervical, thoracic, rib, lumbar and sacral areas  Patient tolerated the procedure well with improvement in symptoms  Patient given exercises, stretches and lifestyle modifications  See medications in patient instructions if given  Patient will follow up in 3-4 weeks 

## 2017-04-28 NOTE — Assessment & Plan Note (Signed)
Patient given injection today. Tolerated the procedure very well on the left sign. We discussed icing regimen. Continue on the medications. Worsening symptoms we'll need to consider further imaging of patient's back. Patient come back in 3 weeks

## 2017-04-28 NOTE — Patient Instructions (Addendum)
Good to see you  Colleen Montgomery is your friend.  You will do great  Kepe being active I like the massage See me again in 3 weeks.

## 2017-04-28 NOTE — Assessment & Plan Note (Signed)
Did significantly better. We discussed icing regimen, home exercises, which activities doing which ones to avoid. We discussed proper ergonomics with her studying as well. Patient will follow-up again in 3-4 weeks

## 2017-05-23 NOTE — Progress Notes (Deleted)
  [facility] [address], [place] [address] Phone: [phone] Subjective:    I'm seeing this patient by the request  of:    CC: Neck pain  [id number]  Colleen Montgomery is a 51 y.o. female coming in with complaint of back pain. Has had difficulty with thoracic as well as lumbar spine pain previously. Seems to be mostly sacroiliac joint dysfunction as well as poor posture and ergonomics. Patient states      No past medical history on file. No past surgical history on file. Social History   Social History  . Marital status: Married    Spouse name: N/A  . Number of children: N/A  . Years of education: N/A   Social History Main Topics  . Smoking status: Never Smoker  . Smokeless tobacco: Never Used  . Alcohol use Not on file  . Drug use: Unknown  . Sexual activity: Not on file   Other Topics Concern  . Not on file   Social History Narrative  . No narrative on file   No Known Allergies No family history on file.   Past medical history, social, surgical and family history all reviewed in electronic medical record.  No pertanent information unless stated regarding to the chief complaint.   Review of Systems:Review of systems updated and as accurate as of 05/23/17  No headache, visual changes, nausea, vomiting, diarrhea, constipation, dizziness, abdominal pain, skin rash, fevers, chills, night sweats, weight loss, swollen lymph nodes, body aches, joint swelling, muscle aches, chest pain, shortness of breath, mood changes.   Objective  There were no vitals taken for this visit. Systems examined below as of 05/23/17   General: No apparent distress alert and oriented x3 mood and affect normal, dressed appropriately.  HEENT: Pupils equal, extraocular movements intact  Respiratory: Patient's speak in full sentences and does not appear short of breath  Cardiovascular: No lower extremity edema, non tender, no erythema  Skin: Warm  dry intact with no signs of infection or rash on extremities or on axial skeleton.  Abdomen: Soft nontender  Neuro: Cranial nerves II through XII are intact, neurovascularly intact in all extremities with 2+ DTRs and 2+ pulses.  Lymph: No lymphadenopathy of posterior or anterior cervical chain or axillae bilaterally.  Gait normal with good balance and coordination.  MSK:  Non tender with full range of motion and good stability and symmetric strength and tone of shoulders, elbows, wrist, hip, knee and ankles bilaterally.  Back Exam:  Inspection: Unremarkable  Motion: Flexion 45 deg, Extension 45 deg, Side Bending to 45 deg bilaterally,  Rotation to 45 deg bilaterally  SLR laying: Negative  XSLR laying: Negative  Palpable tenderness: None. FABER: negative. Sensory change: Gross sensation intact to all lumbar and sacral dermatomes.  Reflexes: 2+ at both patellar tendons, 2+ at achilles tendons, Babinski's downgoing.  Strength at foot  Plantar-flexion: 5/5 Dorsi-flexion: 5/5 Eversion: 5/5 Inversion: 5/5  Leg strength  Quad: 5/5 Hamstring: 5/5 Hip flexor: 5/5 Hip abductors: 5/5  Gait unremarkable.    Impression and Recommendations:     This case required medical decision making of moderate complexity.      Note: This dictation was prepared with Dragon dictation along with smaller phrase technology. Any transcriptional errors that result from this process are unintentional.

## 2017-05-24 ENCOUNTER — Ambulatory Visit: Payer: BLUE CROSS/BLUE SHIELD | Admitting: Family Medicine

## 2017-05-27 ENCOUNTER — Encounter: Payer: Self-pay | Admitting: Family Medicine

## 2017-05-27 ENCOUNTER — Ambulatory Visit (INDEPENDENT_AMBULATORY_CARE_PROVIDER_SITE_OTHER): Payer: BLUE CROSS/BLUE SHIELD | Admitting: Family Medicine

## 2017-05-27 VITALS — BP 120/60 | HR 68 | Ht 66.0 in | Wt 165.0 lb

## 2017-05-27 DIAGNOSIS — M999 Biomechanical lesion, unspecified: Secondary | ICD-10-CM

## 2017-05-27 DIAGNOSIS — M533 Sacrococcygeal disorders, not elsewhere classified: Secondary | ICD-10-CM | POA: Diagnosis not present

## 2017-05-27 MED ORDER — GABAPENTIN 100 MG PO CAPS: 200.0000 mg | ORAL_CAPSULE | Freq: Every day | ORAL | 3 refills | Status: DC

## 2017-05-27 NOTE — Assessment & Plan Note (Signed)
Patient did not make significant improvement with the injection. Could be possibly an L3 nerve root impingement. We discussed the possibility of advanced imaging but patient would not want change to medical management. Started on gabapentin low dose at night to see if this will make any significant difference. We discussed icing regimen, encourage core strength. Once patient starts her job we will discuss other changes that could be beneficial. Follow-up again in 4-6 weeks.

## 2017-05-27 NOTE — Assessment & Plan Note (Signed)
Decision today to treat with OMT was based on Physical Exam  After verbal consent patient was treated with HVLA, ME, FPR techniques in cervical, thoracic, rib lumbar and sacral areas  Patient tolerated the procedure well with improvement in symptoms  Patient given exercises, stretches and lifestyle modifications  See medications in patient instructions if given  Patient will follow up in 4-6 weeks 

## 2017-05-27 NOTE — Patient Instructions (Addendum)
Good to see you  Sorry the injection did not help but now we know Gabapentin 200mg  at night Get back into a routine when you can  Enjoy your trip See me again in 4-6 weeks.

## 2017-05-27 NOTE — Progress Notes (Signed)
[facility]   [address], [place] [address] Phone: [phone] Subjective:    I'm seeing this patient by the request  of:    CC: Neck pain  [id number]  Colleen Montgomery-Payment is a 51 y.o. female coming in with complaint of back pain. Has had difficulty with thoracic as well as lumbar spine pain previously. Seems to be mostly sacroiliac joint dysfunction as well as poor posture and ergonomics. Patient was having worsening pain we need to a left sacroiliac injection on 04/28/2017. Patient statesMinimal change. Patient though has had significant less stress recently because patient did pass her chest. Started her job November 12. Patient will be traveling soon. Still having pain mostly on the left buttocks area. Worse with flexing of the hip she has noticed.    previous back x-rays were unremarkable for any type of bony abnormality.  No past medical history on file. No past surgical history on file. Social History   Social History  . Marital status: Married    Spouse name: N/A  . Number of children: N/A  . Years of education: N/A   Social History Main Topics  . Smoking status: Never Smoker  . Smokeless tobacco: Never Used  . Alcohol use None  . Drug use: Unknown  . Sexual activity: Not Asked   Other Topics Concern  . None   Social History Narrative  . None   No Known Allergies No family history on file.   Past medical history, social, surgical and family history all reviewed in electronic medical record.  No pertanent information unless stated regarding to the chief complaint.   Review of Systems:Review of systems updated and as accurate as of 05/27/17  No headache, visual changes, nausea, vomiting, diarrhea, constipation, dizziness, abdominal pain, skin rash, fevers, chills, night sweats, weight loss, swollen lymph nodes, body aches, joint swelling,chest pain, shortness of breath, mood changes. Mild positive muscle  aches  Objective  Blood pressure 120/60, pulse 68, height 5\' 6"  (1.676 m), weight 165 lb (74.8 kg), SpO2 91 %. Systems examined below as of 05/27/17   General: No apparent distress alert and oriented x3 mood and affect normal, dressed appropriately.  HEENT: Pupils equal, extraocular movements intact  Respiratory: Patient's speak in full sentences and does not appear short of breath  Cardiovascular: No lower extremity edema, non tender, no erythema  Skin: Warm dry intact with no signs of infection or rash on extremities or on axial skeleton.  Abdomen: Soft nontender  Neuro: Cranial nerves II through XII are intact, neurovascularly intact in all extremities with 2+ DTRs and 2+ pulses.  Lymph: No lymphadenopathy of posterior or anterior cervical chain or axillae bilaterally.  Gait normal with good balance and coordination.  MSK:  Non tender with full range of motion and good stability and symmetric strength and tone of shoulders, elbows, wrist, hip, knee and ankles bilaterally.  Back Exam:  Inspection: Unremarkable  Motion: Flexion 45 deg, Extension 30 deg, Side Bending to 45 deg bilaterally,  Rotation to 45 deg bilaterally  SLR laying: Negative  XSLR laying: Negative  Palpable tenderness: Tender to palpation over the left sacroiliac joint as well as the Pierce, musculature from L2-L5. FABER: Positive on the left. Sensory change: Gross sensation intact to all lumbar and sacral dermatomes.  Reflexes: 2+ at both patellar tendons, 2+ at achilles tendons, Babinski's downgoing.  Strength at foot  Plantar-flexion: 5/5 Dorsi-flexion: 5/5 Eversion: 5/5 Inversion: 5/5  Leg strength  Quad: 5/5  Hamstring: 5/5 Hip flexor: 5/5 Hip abductors: 5/5  Gait unremarkable.  Osteopathic findings C2 flexed rotated and side bent right C4 flexed rotated and side bent left C7 flexed rotated and side bent left T3 extended rotated and side bent right inhaled third rib T7 extended rotated and side bent left L2  flexed rotated and side bent right Sacrum right on right    Impression and Recommendations:     This case required medical decision making of moderate complexity.      Note: This dictation was prepared with Dragon dictation along with smaller phrase technology. Any transcriptional errors that result from this process are unintentional.

## 2017-06-21 NOTE — Progress Notes (Signed)
[facility] [address], [place] [address] Phone: [phone] Subjective:    I'm seeing this patient by the request  of:    CC: Pain follow-up  [id number]  Colleen Montgomery is a 51 y.o. female coming in with complaint of back pain.  Patient has been seen previously and had an sacroiliac injection April 28, 2017.  Concern for more of a back pain.  Gabapentin was started on May 27, 2017.  Patient states doing somewhat better overall.  Continues to have some discomfort.  Patient states that the left sacroiliac joint is better but still not gone.  Still some mild radiation. Did not take the gabapentin.    Shows lumbar spine dated January 27, 2018 showed no significant bony abnormality.  No past medical history on file. No past surgical history on file. Social History   Socioeconomic History  . Marital status: Married    Spouse name: None  . Number of children: None  . Years of education: None  . Highest education level: None  Social Needs  . Financial resource strain: None  . Food insecurity - worry: None  . Food insecurity - inability: None  . Transportation needs - medical: None  . Transportation needs - non-medical: None  Occupational History  . None  Tobacco Use  . Smoking status: Never Smoker  . Smokeless tobacco: Never Used  Substance and Sexual Activity  . Alcohol use: None  . Drug use: None  . Sexual activity: None  Other Topics Concern  . None  Social History Narrative  . None   No Known Allergies No family history on file.  No family history of autoimmune disease   Past medical history, social, surgical and family history all reviewed in electronic medical record.  No pertanent information unless stated regarding to the chief complaint.   Review of Systems:Review of systems updated and as accurate as of 06/22/17  No headache, visual changes, nausea, vomiting, diarrhea, constipation, dizziness, abdominal  pain, skin rash, fevers, chills, night sweats, weight loss, swollen lymph nodes, body aches, joint swelling, muscle aches, chest pain, shortness of breath, mood changes.   Objective  Blood pressure 110/80, pulse 78, height 5\' 6"  (1.676 m), weight 162 lb (73.5 kg), SpO2 97 %. Systems examined below as of 06/22/17   General: No apparent distress alert and oriented x3 mood and affect normal, dressed appropriately.  HEENT: Pupils equal, extraocular movements intact  Respiratory: Patient's speak in full sentences and does not appear short of breath  Cardiovascular: No lower extremity edema, non tender, no erythema  Skin: Warm dry intact with no signs of infection or rash on extremities or on axial skeleton.  Abdomen: Soft nontender  Neuro: Cranial nerves II through XII are intact, neurovascularly intact in all extremities with 2+ DTRs and 2+ pulses.  Lymph: No lymphadenopathy of posterior or anterior cervical chain or axillae bilaterally.  Gait normal with good balance and coordination.  MSK:  Non tender with full range of motion and good stability and symmetric strength and tone of shoulders, elbows, wrist, hip, knee and ankles bilaterally.  Back Exam:  Inspection: Thoracolumbar juncture Motion: Flexion 45 deg, Extension 25 deg, Side Bending to 45 deg bilaterally,  Rotation to 45 deg bilaterally  SLR laying: Negative  XSLR laying: Negative  Palpable tenderness: Tender to palpation of the paraspinal musculature lumbar line.  Seems to be more though in the thoracolumbar juncture than usual.  Only mild discomfort over the left sacroiliac  joint FABER: negative. Sensory change: Gross sensation intact to all lumbar and sacral dermatomes.  Reflexes: 2+ at both patellar tendons, 2+ at achilles tendons, Babinski's downgoing.  Strength at foot  Plantar-flexion: 5/5 Dorsi-flexion: 5/5 Eversion: 5/5 Inversion: 5/5  Leg strength  Quad: 5/5 Hamstring: 5/5 Hip flexor: 5/5 Hip abductors: 5/5  Gait  unremarkable.   Osteopathic findings C2 flexed rotated and side bent right C4 flexed rotated and side bent left C6 flexed rotated and side bent left T3 extended rotated and side bent right inhaled third rib T9 extended rotated and side bent left L2 flexed rotated and side bent left  Sacrum left and left    Impression and Recommendations:     This case required medical decision making of moderate complexity.      Note: This dictation was prepared with Dragon dictation along with smaller phrase technology. Any transcriptional errors that result from this process are unintentional.

## 2017-06-22 ENCOUNTER — Ambulatory Visit (INDEPENDENT_AMBULATORY_CARE_PROVIDER_SITE_OTHER): Payer: BLUE CROSS/BLUE SHIELD | Admitting: Family Medicine

## 2017-06-22 ENCOUNTER — Encounter: Payer: Self-pay | Admitting: Family Medicine

## 2017-06-22 VITALS — BP 110/80 | HR 78 | Ht 66.0 in | Wt 162.0 lb

## 2017-06-22 DIAGNOSIS — M940 Chondrocostal junction syndrome [Tietze]: Secondary | ICD-10-CM

## 2017-06-22 DIAGNOSIS — M533 Sacrococcygeal disorders, not elsewhere classified: Secondary | ICD-10-CM | POA: Diagnosis not present

## 2017-06-22 DIAGNOSIS — M999 Biomechanical lesion, unspecified: Secondary | ICD-10-CM

## 2017-06-22 MED ORDER — GABAPENTIN 100 MG PO CAPS: 200.0000 mg | ORAL_CAPSULE | Freq: Every day | ORAL | 3 refills | Status: DC

## 2017-06-22 NOTE — Assessment & Plan Note (Signed)
More of a slipped rib syndrome.  Discussed icing regimen and home exercise discussed which activities are doing which wants to avoid.  Discussed avoiding certain activities.  Patient will come back and see me again in 6-8 weeks.

## 2017-06-22 NOTE — Assessment & Plan Note (Signed)
Decision today to treat with OMT was based on Physical Exam  After verbal consent patient was treated with HVLA, ME, FPR techniques in cervical, thoracic, rib, lumbar and sacral areas  Patient tolerated the procedure well with improvement in symptoms  Patient given exercises, stretches and lifestyle modifications  See medications in patient instructions if given  Patient will follow up in 6-8 weeks 

## 2017-06-22 NOTE — Patient Instructions (Signed)
See you again in 1--2 months

## 2017-07-07 ENCOUNTER — Ambulatory Visit: Payer: BLUE CROSS/BLUE SHIELD | Admitting: Family Medicine

## 2017-09-16 DIAGNOSIS — M9902 Segmental and somatic dysfunction of thoracic region: Secondary | ICD-10-CM | POA: Diagnosis not present

## 2017-09-16 DIAGNOSIS — M7632 Iliotibial band syndrome, left leg: Secondary | ICD-10-CM | POA: Diagnosis not present

## 2017-09-16 DIAGNOSIS — M9901 Segmental and somatic dysfunction of cervical region: Secondary | ICD-10-CM | POA: Diagnosis not present

## 2017-09-16 DIAGNOSIS — M531 Cervicobrachial syndrome: Secondary | ICD-10-CM | POA: Diagnosis not present

## 2017-09-16 DIAGNOSIS — M9903 Segmental and somatic dysfunction of lumbar region: Secondary | ICD-10-CM | POA: Diagnosis not present

## 2017-09-16 DIAGNOSIS — M9905 Segmental and somatic dysfunction of pelvic region: Secondary | ICD-10-CM | POA: Diagnosis not present

## 2017-09-16 DIAGNOSIS — M62830 Muscle spasm of back: Secondary | ICD-10-CM | POA: Diagnosis not present

## 2017-09-20 DIAGNOSIS — L439 Lichen planus, unspecified: Secondary | ICD-10-CM | POA: Diagnosis not present

## 2017-09-20 DIAGNOSIS — D1801 Hemangioma of skin and subcutaneous tissue: Secondary | ICD-10-CM | POA: Diagnosis not present

## 2017-09-20 DIAGNOSIS — D485 Neoplasm of uncertain behavior of skin: Secondary | ICD-10-CM | POA: Diagnosis not present

## 2017-09-20 DIAGNOSIS — L738 Other specified follicular disorders: Secondary | ICD-10-CM | POA: Diagnosis not present

## 2017-09-20 DIAGNOSIS — L821 Other seborrheic keratosis: Secondary | ICD-10-CM | POA: Diagnosis not present

## 2017-11-08 DIAGNOSIS — L570 Actinic keratosis: Secondary | ICD-10-CM | POA: Diagnosis not present

## 2017-11-22 ENCOUNTER — Other Ambulatory Visit: Payer: Self-pay | Admitting: Obstetrics and Gynecology

## 2017-11-22 ENCOUNTER — Other Ambulatory Visit (HOSPITAL_COMMUNITY)
Admission: RE | Admit: 2017-11-22 | Discharge: 2017-11-22 | Disposition: A | Discharge status: Home / Self Care | Payer: 59 | Source: Ambulatory Visit | Attending: Emergency Medicine | Admitting: Emergency Medicine

## 2017-11-22 DIAGNOSIS — Z01411 Encounter for gynecological examination (general) (routine) with abnormal findings: Secondary | ICD-10-CM | POA: Diagnosis not present

## 2017-11-24 DIAGNOSIS — R209 Unspecified disturbances of skin sensation: Secondary | ICD-10-CM | POA: Diagnosis not present

## 2017-11-24 DIAGNOSIS — R5383 Other fatigue: Secondary | ICD-10-CM | POA: Diagnosis not present

## 2017-11-24 DIAGNOSIS — E039 Hypothyroidism, unspecified: Secondary | ICD-10-CM | POA: Diagnosis not present

## 2017-11-24 DIAGNOSIS — F411 Generalized anxiety disorder: Secondary | ICD-10-CM | POA: Diagnosis not present

## 2017-11-24 LAB — CYTOLOGY - PAP
Diagnosis: NEGATIVE
HPV (WINDOPATH): NOT DETECTED

## 2017-12-02 ENCOUNTER — Other Ambulatory Visit: Payer: Self-pay | Admitting: Obstetrics and Gynecology

## 2017-12-02 DIAGNOSIS — Z139 Encounter for screening, unspecified: Secondary | ICD-10-CM

## 2017-12-20 ENCOUNTER — Ambulatory Visit
Admission: RE | Admit: 2017-12-20 | Discharge: 2017-12-20 | Disposition: A | Discharge status: Home / Self Care | Payer: 59 | Source: Ambulatory Visit | Attending: Obstetrics and Gynecology | Admitting: Obstetrics and Gynecology

## 2017-12-20 ENCOUNTER — Encounter: Payer: Self-pay | Admitting: Radiology

## 2017-12-20 DIAGNOSIS — Z139 Encounter for screening, unspecified: Secondary | ICD-10-CM

## 2017-12-20 DIAGNOSIS — Z1231 Encounter for screening mammogram for malignant neoplasm of breast: Secondary | ICD-10-CM | POA: Diagnosis not present

## 2017-12-28 DIAGNOSIS — K136 Irritative hyperplasia of oral mucosa: Secondary | ICD-10-CM | POA: Diagnosis not present

## 2018-01-31 DIAGNOSIS — M9907 Segmental and somatic dysfunction of upper extremity: Secondary | ICD-10-CM | POA: Diagnosis not present

## 2018-01-31 DIAGNOSIS — M9903 Segmental and somatic dysfunction of lumbar region: Secondary | ICD-10-CM | POA: Diagnosis not present

## 2018-01-31 DIAGNOSIS — M531 Cervicobrachial syndrome: Secondary | ICD-10-CM | POA: Diagnosis not present

## 2018-01-31 DIAGNOSIS — M62830 Muscle spasm of back: Secondary | ICD-10-CM | POA: Diagnosis not present

## 2018-01-31 DIAGNOSIS — M9901 Segmental and somatic dysfunction of cervical region: Secondary | ICD-10-CM | POA: Diagnosis not present

## 2018-01-31 DIAGNOSIS — M9905 Segmental and somatic dysfunction of pelvic region: Secondary | ICD-10-CM | POA: Diagnosis not present

## 2018-01-31 DIAGNOSIS — M9908 Segmental and somatic dysfunction of rib cage: Secondary | ICD-10-CM | POA: Diagnosis not present

## 2018-01-31 DIAGNOSIS — M9902 Segmental and somatic dysfunction of thoracic region: Secondary | ICD-10-CM | POA: Diagnosis not present

## 2018-02-03 DIAGNOSIS — M531 Cervicobrachial syndrome: Secondary | ICD-10-CM | POA: Diagnosis not present

## 2018-02-03 DIAGNOSIS — M62830 Muscle spasm of back: Secondary | ICD-10-CM | POA: Diagnosis not present

## 2018-02-03 DIAGNOSIS — M9903 Segmental and somatic dysfunction of lumbar region: Secondary | ICD-10-CM | POA: Diagnosis not present

## 2018-02-03 DIAGNOSIS — M9905 Segmental and somatic dysfunction of pelvic region: Secondary | ICD-10-CM | POA: Diagnosis not present

## 2018-02-03 DIAGNOSIS — M9907 Segmental and somatic dysfunction of upper extremity: Secondary | ICD-10-CM | POA: Diagnosis not present

## 2018-02-03 DIAGNOSIS — M9901 Segmental and somatic dysfunction of cervical region: Secondary | ICD-10-CM | POA: Diagnosis not present

## 2018-02-03 DIAGNOSIS — M9902 Segmental and somatic dysfunction of thoracic region: Secondary | ICD-10-CM | POA: Diagnosis not present

## 2018-02-03 DIAGNOSIS — M9908 Segmental and somatic dysfunction of rib cage: Secondary | ICD-10-CM | POA: Diagnosis not present

## 2018-02-07 DIAGNOSIS — M9907 Segmental and somatic dysfunction of upper extremity: Secondary | ICD-10-CM | POA: Diagnosis not present

## 2018-02-07 DIAGNOSIS — M9905 Segmental and somatic dysfunction of pelvic region: Secondary | ICD-10-CM | POA: Diagnosis not present

## 2018-02-07 DIAGNOSIS — M531 Cervicobrachial syndrome: Secondary | ICD-10-CM | POA: Diagnosis not present

## 2018-02-07 DIAGNOSIS — M9903 Segmental and somatic dysfunction of lumbar region: Secondary | ICD-10-CM | POA: Diagnosis not present

## 2018-02-07 DIAGNOSIS — M62830 Muscle spasm of back: Secondary | ICD-10-CM | POA: Diagnosis not present

## 2018-02-07 DIAGNOSIS — M9908 Segmental and somatic dysfunction of rib cage: Secondary | ICD-10-CM | POA: Diagnosis not present

## 2018-02-07 DIAGNOSIS — M9902 Segmental and somatic dysfunction of thoracic region: Secondary | ICD-10-CM | POA: Diagnosis not present

## 2018-02-07 DIAGNOSIS — M9901 Segmental and somatic dysfunction of cervical region: Secondary | ICD-10-CM | POA: Diagnosis not present

## 2018-02-10 DIAGNOSIS — M9905 Segmental and somatic dysfunction of pelvic region: Secondary | ICD-10-CM | POA: Diagnosis not present

## 2018-02-10 DIAGNOSIS — M531 Cervicobrachial syndrome: Secondary | ICD-10-CM | POA: Diagnosis not present

## 2018-02-10 DIAGNOSIS — M9903 Segmental and somatic dysfunction of lumbar region: Secondary | ICD-10-CM | POA: Diagnosis not present

## 2018-02-10 DIAGNOSIS — M62830 Muscle spasm of back: Secondary | ICD-10-CM | POA: Diagnosis not present

## 2018-02-10 DIAGNOSIS — M9901 Segmental and somatic dysfunction of cervical region: Secondary | ICD-10-CM | POA: Diagnosis not present

## 2018-02-10 DIAGNOSIS — M9902 Segmental and somatic dysfunction of thoracic region: Secondary | ICD-10-CM | POA: Diagnosis not present

## 2018-02-10 DIAGNOSIS — M9908 Segmental and somatic dysfunction of rib cage: Secondary | ICD-10-CM | POA: Diagnosis not present

## 2018-02-10 DIAGNOSIS — M9907 Segmental and somatic dysfunction of upper extremity: Secondary | ICD-10-CM | POA: Diagnosis not present

## 2018-02-12 ENCOUNTER — Ambulatory Visit: Payer: Self-pay | Admitting: Family Medicine

## 2018-02-12 VITALS — BP 118/76 | HR 80 | Temp 98.2°F | Resp 20 | Ht 66.0 in | Wt 157.0 lb

## 2018-02-12 DIAGNOSIS — Z0000 Encounter for general adult medical examination without abnormal findings: Secondary | ICD-10-CM

## 2018-02-12 NOTE — Patient Instructions (Signed)

## 2018-02-12 NOTE — Progress Notes (Signed)
Colleen Montgomery is a 52 y.o. female who presents today with concerns of need for annual employment physical exam. Patient denies any acute conditions that need to be addressed today this visit.  Review of Systems  Constitutional: Negative for chills, fever and malaise/fatigue.  HENT: Negative for congestion, ear discharge, ear pain, sinus pain and sore throat.   Eyes: Negative.   Respiratory: Negative for cough, sputum production and shortness of breath.   Cardiovascular: Negative.  Negative for chest pain.  Gastrointestinal: Negative for abdominal pain, diarrhea, nausea and vomiting.  Genitourinary: Negative for dysuria, frequency, hematuria and urgency.  Musculoskeletal: Negative for myalgias.  Skin: Negative.   Neurological: Negative for headaches.  Endo/Heme/Allergies: Negative.   Psychiatric/Behavioral: Negative.     O: Vitals:   02/12/18 1044  BP: 118/76  Pulse: 80  Resp: 20  Temp: 98.2 F (36.8 C)  SpO2: 99%     Physical Exam  Constitutional: She is oriented to person, place, and time. Vital signs are normal. She appears well-developed and well-nourished. She is active.  Non-toxic appearance. She does not have a sickly appearance.  HENT:  Head: Normocephalic.  Right Ear: Hearing, tympanic membrane, external ear and ear canal normal.  Left Ear: Hearing, tympanic membrane, external ear and ear canal normal.  Nose: Nose normal.  Mouth/Throat: Uvula is midline and oropharynx is clear and moist.  Neck: Normal range of motion. Neck supple.  Cardiovascular: Normal rate, regular rhythm, normal heart sounds and normal pulses.  Pulmonary/Chest: Effort normal and breath sounds normal.  Abdominal: Soft. Bowel sounds are normal.  Musculoskeletal: Normal range of motion.  Lymphadenopathy:       Head (right side): No submental and no submandibular adenopathy present.       Head (left side): No submental and no submandibular adenopathy present.    She has no cervical  adenopathy.  Neurological: She is alert and oriented to person, place, and time.  Psychiatric: She has a normal mood and affect. Her speech is normal and behavior is normal. Cognition and memory are normal.  PHQ-9 + Denies desire for self harm or harm of others- current personal family challenges Seeking care of therapist Potective factors of local family and friend and employment  Vitals reviewed.  A: 1. Physical exam    P: Exam findings, diagnosis etiology and medication use and indications reviewed with patient. Follow- Up and discharge instructions provided. No emergent/urgent issues found on exam.  Patient verbalized understanding of information provided and agrees with plan of care (POC), all questions answered.  1. Physical exam WNL-completed

## 2018-05-25 DIAGNOSIS — H6980 Other specified disorders of Eustachian tube, unspecified ear: Secondary | ICD-10-CM | POA: Diagnosis not present

## 2018-06-26 DIAGNOSIS — H6691 Otitis media, unspecified, right ear: Secondary | ICD-10-CM | POA: Diagnosis not present

## 2018-07-07 ENCOUNTER — Inpatient Hospital Stay: Payer: 59 | Admitting: Hematology

## 2018-07-07 ENCOUNTER — Telehealth: Payer: Self-pay

## 2018-07-07 ENCOUNTER — Telehealth: Payer: Self-pay | Admitting: Hematology

## 2018-07-07 NOTE — Telephone Encounter (Signed)
Appointment was made for this patient. Per 11/4 vm mail return calls. She is not a current patient of [facility] and she was recalled and appointment was canceled. Per 11/21 follow up

## 2018-07-07 NOTE — Telephone Encounter (Signed)
Tried to reach regarding voicemail °

## 2018-07-07 NOTE — Telephone Encounter (Signed)
Called and left a second vm with my return office number.

## 2018-09-17 DIAGNOSIS — J069 Acute upper respiratory infection, unspecified: Secondary | ICD-10-CM | POA: Diagnosis not present

## 2018-09-17 DIAGNOSIS — J029 Acute pharyngitis, unspecified: Secondary | ICD-10-CM | POA: Diagnosis not present

## 2018-09-20 DIAGNOSIS — F418 Other specified anxiety disorders: Secondary | ICD-10-CM | POA: Diagnosis not present

## 2018-09-20 DIAGNOSIS — E039 Hypothyroidism, unspecified: Secondary | ICD-10-CM | POA: Diagnosis not present

## 2019-01-20 DIAGNOSIS — L570 Actinic keratosis: Secondary | ICD-10-CM | POA: Diagnosis not present

## 2019-01-21 DIAGNOSIS — R0789 Other chest pain: Secondary | ICD-10-CM | POA: Diagnosis not present

## 2019-04-13 DIAGNOSIS — Z1231 Encounter for screening mammogram for malignant neoplasm of breast: Secondary | ICD-10-CM | POA: Diagnosis not present

## 2019-04-13 DIAGNOSIS — Z01419 Encounter for gynecological examination (general) (routine) without abnormal findings: Secondary | ICD-10-CM | POA: Diagnosis not present

## 2019-04-13 DIAGNOSIS — Z13820 Encounter for screening for osteoporosis: Secondary | ICD-10-CM | POA: Diagnosis not present

## 2019-04-13 DIAGNOSIS — Z6826 Body mass index (BMI) 26.0-26.9, adult: Secondary | ICD-10-CM | POA: Diagnosis not present

## 2019-04-18 DIAGNOSIS — L739 Follicular disorder, unspecified: Secondary | ICD-10-CM | POA: Diagnosis not present

## 2019-04-18 DIAGNOSIS — L820 Inflamed seborrheic keratosis: Secondary | ICD-10-CM | POA: Diagnosis not present

## 2019-04-18 DIAGNOSIS — D225 Melanocytic nevi of trunk: Secondary | ICD-10-CM | POA: Diagnosis not present

## 2019-04-18 DIAGNOSIS — D1801 Hemangioma of skin and subcutaneous tissue: Secondary | ICD-10-CM | POA: Diagnosis not present

## 2019-04-18 DIAGNOSIS — L720 Epidermal cyst: Secondary | ICD-10-CM | POA: Diagnosis not present

## 2019-05-27 ENCOUNTER — Encounter (HOSPITAL_COMMUNITY): Payer: Self-pay | Admitting: *Deleted

## 2019-05-27 ENCOUNTER — Other Ambulatory Visit: Payer: Self-pay

## 2019-05-27 ENCOUNTER — Emergency Department (HOSPITAL_COMMUNITY): Payer: BC Managed Care – PPO

## 2019-05-27 ENCOUNTER — Emergency Department (HOSPITAL_COMMUNITY)
Admission: EM | Admit: 2019-05-27 | Discharge: 2019-05-27 | Disposition: A | Discharge status: Home / Self Care | Payer: BC Managed Care – PPO | Attending: Emergency Medicine | Admitting: Emergency Medicine

## 2019-05-27 DIAGNOSIS — R1032 Left lower quadrant pain: Secondary | ICD-10-CM | POA: Insufficient documentation

## 2019-05-27 DIAGNOSIS — R110 Nausea: Secondary | ICD-10-CM | POA: Insufficient documentation

## 2019-05-27 DIAGNOSIS — Z79899 Other long term (current) drug therapy: Secondary | ICD-10-CM | POA: Insufficient documentation

## 2019-05-27 DIAGNOSIS — R319 Hematuria, unspecified: Secondary | ICD-10-CM | POA: Insufficient documentation

## 2019-05-27 DIAGNOSIS — K5730 Diverticulosis of large intestine without perforation or abscess without bleeding: Secondary | ICD-10-CM | POA: Diagnosis not present

## 2019-05-27 HISTORY — DX: Disorder of thyroid, unspecified: E07.9

## 2019-05-27 HISTORY — DX: Diverticulosis of intestine, part unspecified, without perforation or abscess without bleeding: K57.90

## 2019-05-27 LAB — CBC
HCT: 41.4 % (ref 36.0–46.0)
Hemoglobin: 13.2 g/dL (ref 12.0–15.0)
MCH: 28.4 pg (ref 26.0–34.0)
MCHC: 31.9 g/dL (ref 30.0–36.0)
MCV: 89.2 fL (ref 80.0–100.0)
Platelets: 245 10*3/uL (ref 150–400)
RBC: 4.64 MIL/uL (ref 3.87–5.11)
RDW: 12.6 % (ref 11.5–15.5)
WBC: 4.8 10*3/uL (ref 4.0–10.5)
nRBC: 0 % (ref 0.0–0.2)

## 2019-05-27 LAB — URINALYSIS, ROUTINE W REFLEX MICROSCOPIC
Bilirubin Urine: NEGATIVE
Glucose, UA: NEGATIVE mg/dL
Ketones, ur: NEGATIVE mg/dL
Leukocytes,Ua: NEGATIVE
Nitrite: NEGATIVE
Protein, ur: NEGATIVE mg/dL
Specific Gravity, Urine: 1.024 (ref 1.005–1.030)
pH: 6 (ref 5.0–8.0)

## 2019-05-27 LAB — COMPREHENSIVE METABOLIC PANEL
ALT: 19 U/L (ref 0–44)
AST: 19 U/L (ref 15–41)
Albumin: 4.7 g/dL (ref 3.5–5.0)
Alkaline Phosphatase: 69 U/L (ref 38–126)
Anion gap: 9 (ref 5–15)
BUN: 21 mg/dL — ABNORMAL HIGH (ref 6–20)
CO2: 25 mmol/L (ref 22–32)
Calcium: 9.1 mg/dL (ref 8.9–10.3)
Chloride: 106 mmol/L (ref 98–111)
Creatinine, Ser: 1.12 mg/dL — ABNORMAL HIGH (ref 0.44–1.00)
GFR calc Af Amer: >60 mL/min (ref >60)
GFR calc non Af Amer: 56 mL/min — ABNORMAL LOW (ref >60)
Glucose, Bld: 102 mg/dL — ABNORMAL HIGH (ref 70–99)
Potassium: 3.6 mmol/L (ref 3.5–5.1)
Sodium: 140 mmol/L (ref 135–145)
Total Bilirubin: 0.6 mg/dL (ref 0.3–1.2)
Total Protein: 7.4 g/dL (ref 6.5–8.1)

## 2019-05-27 LAB — I-STAT BETA HCG BLOOD, ED (MC, WL, AP ONLY): I-stat hCG, quantitative: <5 m[IU]/mL (ref <5)

## 2019-05-27 LAB — LIPASE, BLOOD: Lipase: 46 U/L (ref 11–51)

## 2019-05-27 MED ORDER — SODIUM CHLORIDE 0.9 % IV BOLUS
1000.0000 mL | Freq: Once | INTRAVENOUS | Status: AC
  Administered 2019-05-27: 1000 mL via INTRAVENOUS

## 2019-05-27 MED ORDER — SODIUM CHLORIDE 0.9% FLUSH: 3.0000 mL | Freq: Once | INTRAVENOUS | Status: DC

## 2019-05-27 MED ORDER — IOHEXOL 300 MG/ML  SOLN
100.0000 mL | Freq: Once | INTRAMUSCULAR | Status: AC | PRN
  Administered 2019-05-27: 100 mL via INTRAVENOUS

## 2019-05-27 MED ORDER — DICYCLOMINE HCL 20 MG PO TABS: 20.0000 mg | ORAL_TABLET | Freq: Two times a day (BID) | ORAL | 0 refills | Status: DC

## 2019-05-27 NOTE — Discharge Instructions (Signed)
Your work-up today is reassuring, urine shows some blood but no signs of infection, your CT overall looks good.  Pain could be related to colon spasm or a recently passed kidney stone.  Use ibuprofen and Tylenol as needed, you can also take Bentyl for colon spasm.  Follow-up with PCP.  Return for new or worsening symptoms.

## 2019-05-27 NOTE — ED Triage Notes (Signed)
Pt with left lower abdominal pain, onset this morning. Pain radiates to the left flank. Denies urinary problems.  LBM this morning.

## 2019-05-27 NOTE — ED Provider Notes (Signed)
[facility] DEPT Provider Note   CSN: [id number] Arrival date & time: 05/27/19  [id number]     History   Chief Complaint Chief Complaint  Patient presents with   Abdominal Pain    HPI Colleen Montgomery is a 53 y.o. female.     Colleen Montgomery is a 53 y.o. female with a history of diverticulosis and hypothyroidism, who presents to the ED for evaluation of left lower quadrant abdominal pain.  Patient reports she woke up this morning felt like she needed to have a bowel movement, had a normal soft stool with no blood or melena, and then developed left lower quadrant abdominal pain.  At first pain was a mild soreness, she then had a second small bowel movement and pain became significantly worse.  She reports associated diaphoresis, some nausea but no vomiting.  Pain is radiated to the back and a little bit up to the flank but she denies dysuria or urinary frequency.  Recently was referred to urology for a small amount of blood noted in her urine, no UTI and is not having any flank pain so no further work-up was done at this time.  Patient reports she took 650 mg of Tylenol just prior to leaving the house and reports that now her pain seems to have resolved but she is concerned given the severity of it that it will come back.  She has known history of diverticulosis but no previous history of diverticulitis.  No abdominal surgeries.  No fevers, chest pain or shortness of breath.  No other aggravating or alleviating factors.     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 No pertinent surgical history.   OB History   No obstetric history on file.      Home Medications    Prior to Admission medications   Medication Sig Start Date End Date Taking? Authorizing Provider  acetaminophen (TYLENOL) 325 MG tablet Take 650 mg by mouth every 6 (six) hours as needed for moderate pain.   Yes [provider]  levothyroxine (SYNTHROID, LEVOTHROID) 75 MCG tablet Take 75 mcg by mouth daily before breakfast.   Yes [provider]  dicyclomine (BENTYL) 20 MG tablet Take 1 tablet (20 mg total) by mouth 2 (two) times daily. 05/27/19   Jacqlyn Larsen, PA-C  gabapentin (NEURONTIN) 100 MG capsule Take 2 capsules (200 mg total) at bedtime by mouth. Patient not taking: Reported on 02/12/2018 06/22/17   Lyndal Pulley, DO    Family History Family History  Problem Relation Age of Onset   Breast cancer Maternal Grandmother     Social History Social History   Tobacco Use   Smoking status: Never Smoker   Smokeless tobacco: Never Used  Substance Use Topics   Alcohol use: Yes   Drug use: Not Currently     Allergies   Patient has no known allergies.   Review of Systems Review of Systems  Constitutional: Negative for chills and fever.  HENT: Negative.   Respiratory: Negative for cough and shortness of breath.   Cardiovascular: Negative for chest  pain.  Gastrointestinal: Positive for abdominal pain and nausea. Negative for blood in stool, constipation, diarrhea and vomiting.  Genitourinary: Positive for flank pain and hematuria. Negative for dysuria, frequency, pelvic pain, vaginal bleeding and vaginal discharge.  Musculoskeletal: Negative for arthralgias and myalgias.  Skin: Negative for color change and rash.  Neurological: Negative for dizziness, syncope and light-headedness.     Physical Exam Updated Vital Signs BP 134/81 (BP Location: Left Arm)    Pulse 86    Resp 18    Ht 5\' 6"  (1.676 m)    Wt 68 kg    SpO2  100%    BMI 24.21 kg/m   Physical Exam Vitals signs and nursing note reviewed.  Constitutional:      General: She is not in acute distress.    Appearance: She is well-developed and normal weight. She is not diaphoretic.     Comments: Well-appearing and in no distress.  HENT:     Head: Normocephalic and atraumatic.     Mouth/Throat:     Mouth: Mucous membranes are moist.     Pharynx: Oropharynx is clear.  Eyes:     General:        Right eye: No discharge.        Left eye: No discharge.     Pupils: Pupils are equal, round, and reactive to light.  Neck:     Musculoskeletal: Neck supple.  Cardiovascular:     Rate and Rhythm: Normal rate and regular rhythm.     Heart sounds: Normal heart sounds.  Pulmonary:     Effort: Pulmonary effort is normal. No respiratory distress.     Breath sounds: Normal breath sounds. No wheezing or rales.  Abdominal:     General: Abdomen is flat. Bowel sounds are normal. There is no distension.     Palpations: Abdomen is soft. There is no mass.     Tenderness: There is abdominal tenderness in the left lower quadrant. There is no right CVA tenderness, left CVA tenderness or guarding.     Comments: Abdomen is soft and nondistended, bowel sounds present, slightly hyperactive, there is mild left lower quadrant abdominal tenderness without guarding or rebound, all other quadrants nontender to palpation.  No CVA tenderness bilaterally  Musculoskeletal:        General: No deformity.  Skin:    General: Skin is warm and dry.     Capillary Refill: Capillary refill takes less than 2 seconds.  Neurological:     Mental Status: She is alert.     Coordination: Coordination normal.     Comments: Speech is clear, able to follow commands Moves extremities without ataxia, coordination intact  Psychiatric:        Mood and Affect: Mood normal.        Behavior: Behavior normal.      ED Treatments / Results  Labs (all labs ordered are listed, but only abnormal  results are displayed) Labs Reviewed  COMPREHENSIVE METABOLIC PANEL - Abnormal; Notable for the following components:      Result Value   Glucose, Bld 102 (*)    BUN 21 (*)    Creatinine, Ser 1.12 (*)    GFR calc non Af Amer 56 (*)    All other components within normal limits  URINALYSIS, ROUTINE W REFLEX MICROSCOPIC - Abnormal; Notable for the following components:   Hgb urine dipstick MODERATE (*)    Bacteria, UA RARE (*)    All other components within normal limits  LIPASE,  BLOOD  CBC  I-STAT BETA HCG BLOOD, ED ([facility], [facility], [facility] ONLY)    EKG None  Radiology Ct Abdomen Pelvis W Contrast  Result Date: 05/27/2019 CLINICAL DATA:  LEFT lower abdominal pain. History of diverticulosis. EXAM: CT ABDOMEN AND PELVIS WITH CONTRAST TECHNIQUE: Multidetector CT imaging of the abdomen and pelvis was performed using the standard protocol following bolus administration of intravenous contrast. CONTRAST:  183mL OMNIPAQUE IOHEXOL 300 MG/ML  SOLN COMPARISON:  None. FINDINGS: Lower chest: Lung bases are clear. Hepatobiliary: No focal hepatic lesion. No biliary duct dilatation. Gallbladder is normal. Common bile duct is normal. Pancreas: Pancreas is normal. No ductal dilatation. No pancreatic inflammation. Spleen: Normal spleen Adrenals/urinary tract: Adrenal glands and kidneys are normal. The ureters and bladder normal. Stomach/Bowel: Stomach, duodenum small-bowel normal. Terminal ileum normal. Appendix not identified. No inflammation associated with the cecum. Ascending and transverse colon normal. Descending colon normal. Minimal diverticulosis of the sigmoid colon. No acute inflammation. Vascular/Lymphatic: Abdominal aorta is normal caliber. No periportal or retroperitoneal adenopathy. No pelvic adenopathy. Reproductive: Uterus and necks are normal. Other: No inguinal hernia or ventral hernia. Musculoskeletal: No aggressive osseous lesion. IMPRESSION: 1. Mild diverticulosis of the sigmoid colon without evidence  acute diverticulitis. 2. No inguinal hernia or ventral hernia. Electronically Signed   By: Suzy Bouchard M.D.   On: 05/27/2019 08:30    Procedures Procedures (including critical care time)  Medications Ordered in ED Medications  sodium chloride 0.9 % bolus 1,000 mL (0 mLs Intravenous Stopped 05/27/19 0845)  iohexol (OMNIPAQUE) 300 MG/ML solution 100 mL (100 mLs Intravenous Contrast Given 05/27/19 0742)     Initial Impression / Assessment and Plan / ED Course  I have reviewed the triage vital signs and the nursing notes.  Pertinent labs & imaging results that were available during my care of the patient were reviewed by me and considered in my medical decision making (see chart for details).  53 year old female presents with sudden onset of left lower quadrant abdominal pain this morning after having a bowel movement.  No melena or hematochezia.  Some mild associated nausea.  Pain seems improved now but it was very severe and associated with diaphoresis on onset.  Pain improved on arrival.  Vitals normal and patient well-appearing.  Mild tenderness in the left lower quadrant on exam but no guarding or peritoneal signs.  Patient has had some pain radiating to the flank.  Concern for potential diverticulitis versus kidney stone.  She is not having any urinary symptoms.  Will get abdominal labs and CT abdomen pelvis.  Declines pain medication at this time.  Labs overall reassuring, no leukocytosis, normal hemoglobin, slight increase in creatinine but no other significant electrolyte derangements, patient does report decreased water intake and thinks she may be a bit dehydrated, 1 L IV fluids given.  Normal liver function and normal lipase.  Negative pregnancy.  Urinalysis with 21-50 RBCs but no signs of infection.   CT with mild diverticulosis of the sigmoid colon but no evidence of diverticulitis, no evidence of renal stones, and no other acute abnormalities within the abdomen or pelvis.   Discussed reassuring CT results with patient.  She could have had a recently passed renal stone given hematuria, could also have colon spasm causing pain.  Will have patient treat with Tylenol and ibuprofen, will prescribe Bentyl to help with potential colon spasm and have patient follow-up with PCP.  Return precautions discussed.  Patient expresses understanding and agreement.  Discharged home in good condition.  Final Clinical  Impressions(s) / ED Diagnoses   Final diagnoses:  Left lower quadrant abdominal pain    ED Discharge Orders         Ordered    dicyclomine (BENTYL) 20 MG tablet  2 times daily     05/27/19 0853           Jacqlyn Larsen, PA-C 05/27/19 [id number]    Lacretia Leigh, MD 05/28/19 1406

## 2019-07-24 DIAGNOSIS — L700 Acne vulgaris: Secondary | ICD-10-CM | POA: Diagnosis not present

## 2019-07-24 DIAGNOSIS — L738 Other specified follicular disorders: Secondary | ICD-10-CM | POA: Diagnosis not present

## 2019-07-24 DIAGNOSIS — L570 Actinic keratosis: Secondary | ICD-10-CM | POA: Diagnosis not present

## 2019-07-24 DIAGNOSIS — L578 Other skin changes due to chronic exposure to nonionizing radiation: Secondary | ICD-10-CM | POA: Diagnosis not present

## 2019-07-24 DIAGNOSIS — D1801 Hemangioma of skin and subcutaneous tissue: Secondary | ICD-10-CM | POA: Diagnosis not present

## 2019-07-24 DIAGNOSIS — L820 Inflamed seborrheic keratosis: Secondary | ICD-10-CM | POA: Diagnosis not present

## 2019-10-19 DIAGNOSIS — E039 Hypothyroidism, unspecified: Secondary | ICD-10-CM | POA: Diagnosis not present

## 2019-10-19 DIAGNOSIS — F418 Other specified anxiety disorders: Secondary | ICD-10-CM | POA: Diagnosis not present

## 2019-10-20 DIAGNOSIS — J0100 Acute maxillary sinusitis, unspecified: Secondary | ICD-10-CM | POA: Diagnosis not present

## 2019-12-05 ENCOUNTER — Other Ambulatory Visit: Payer: Self-pay | Admitting: Obstetrics and Gynecology

## 2019-12-05 DIAGNOSIS — Z1231 Encounter for screening mammogram for malignant neoplasm of breast: Secondary | ICD-10-CM

## 2020-01-08 DIAGNOSIS — J0110 Acute frontal sinusitis, unspecified: Secondary | ICD-10-CM | POA: Diagnosis not present

## 2020-01-08 DIAGNOSIS — H9201 Otalgia, right ear: Secondary | ICD-10-CM | POA: Diagnosis not present

## 2020-01-09 DIAGNOSIS — J209 Acute bronchitis, unspecified: Secondary | ICD-10-CM | POA: Diagnosis not present

## 2020-01-09 DIAGNOSIS — R05 Cough: Secondary | ICD-10-CM | POA: Diagnosis not present

## 2020-02-15 DIAGNOSIS — E039 Hypothyroidism, unspecified: Secondary | ICD-10-CM | POA: Diagnosis not present

## 2020-03-06 IMAGING — MG DIGITAL SCREENING BILATERAL MAMMOGRAM WITH TOMO AND CAD
8 series · 9 of 24 positions shown · non-contrast
Comparison: Previous exam(s).

CLINICAL DATA: Screening.

EXAM:
DIGITAL SCREENING BILATERAL MAMMOGRAM WITH TOMO AND CAD

[L MLO synth-2D]
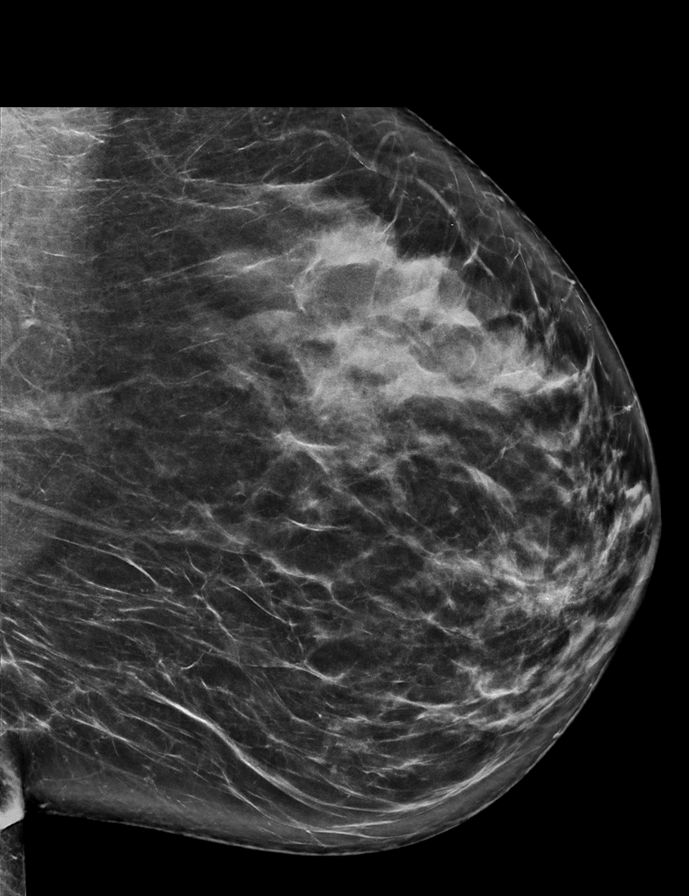

[R CC synth-2D]
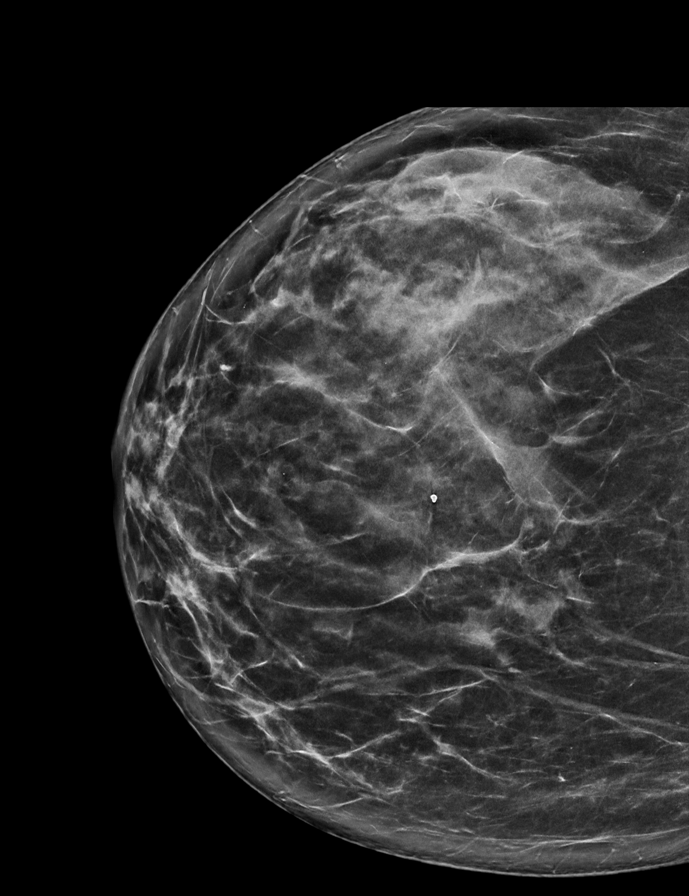

[R MLO synth-2D]
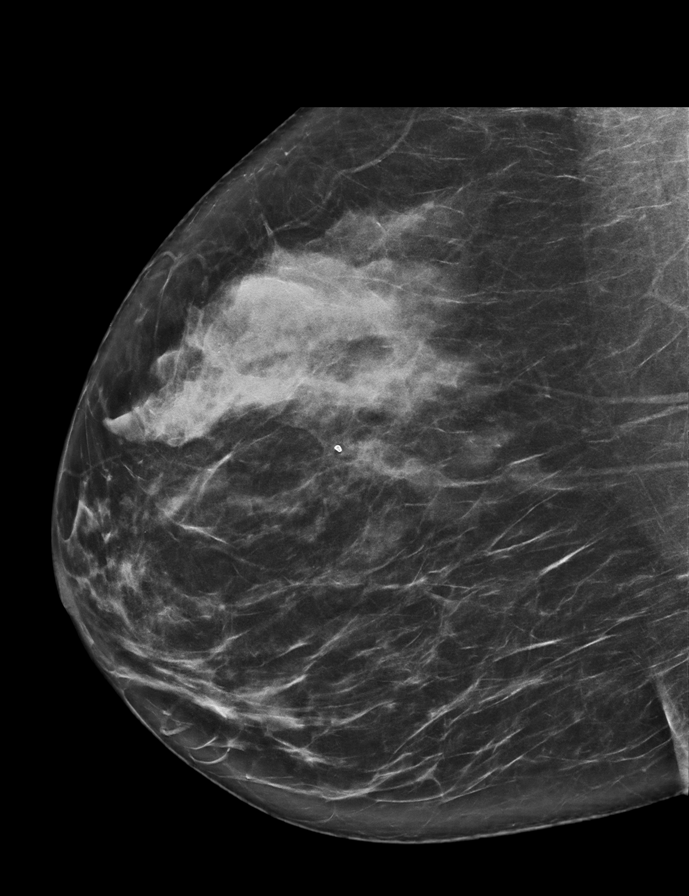

[L CC synth-2D]
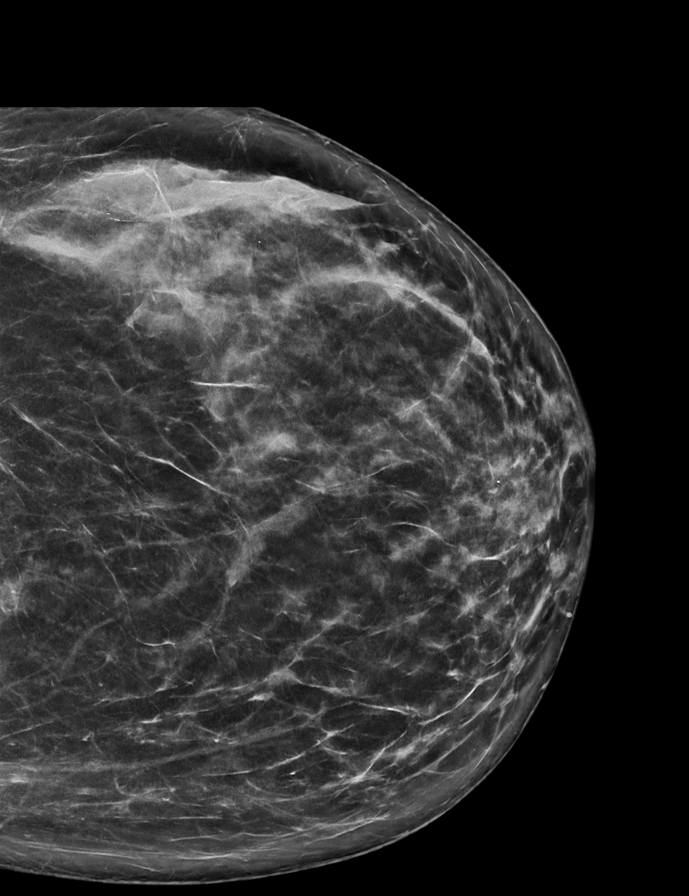

[L MLO tomo · 2 of 77 frames shown]
[frame 25/77]
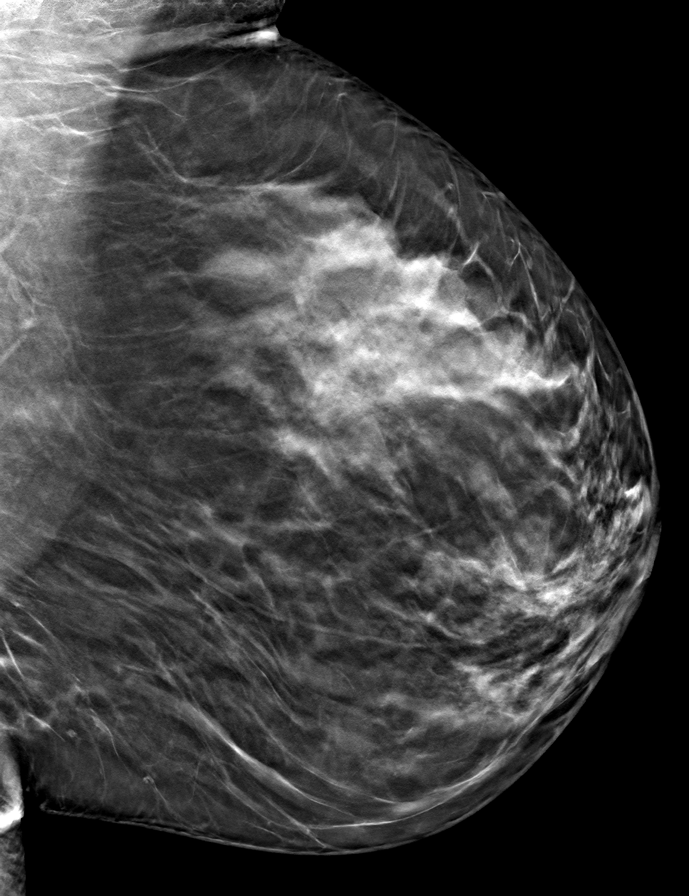
[frame 39/77]
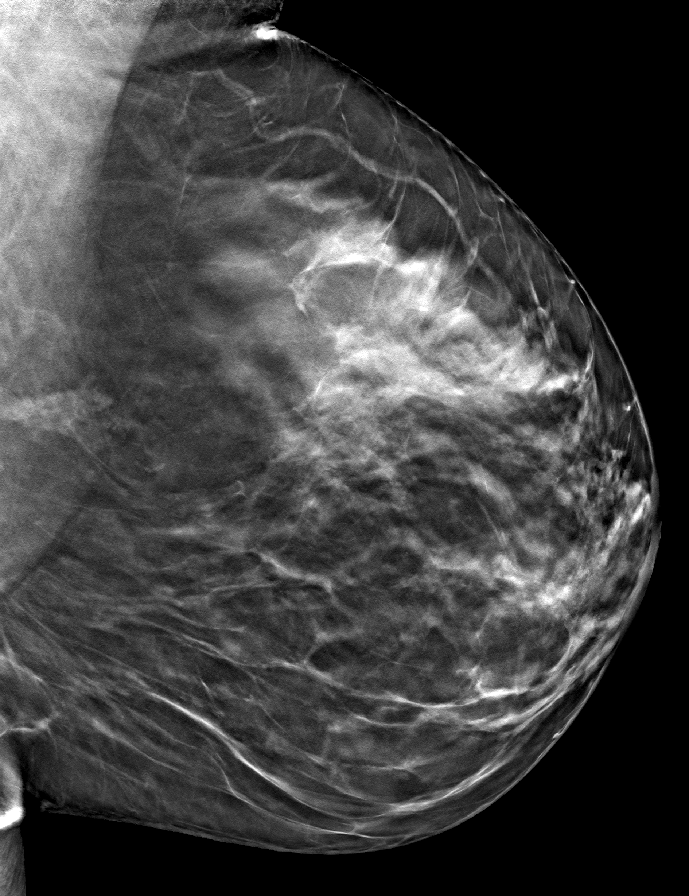

[L CC tomo · tomo slice 36/71.0]
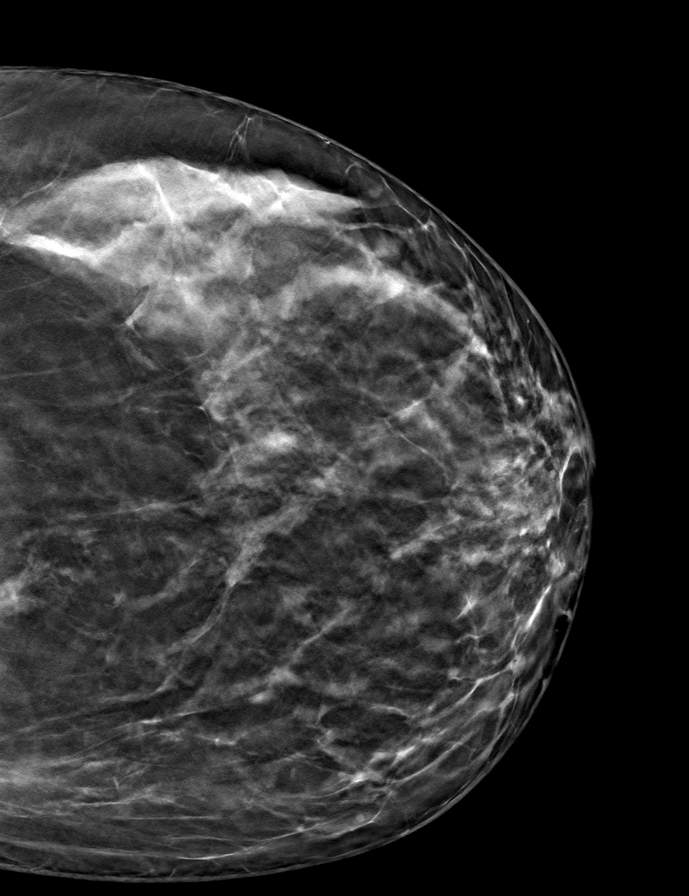

[R MLO tomo · tomo slice 39/76.0]
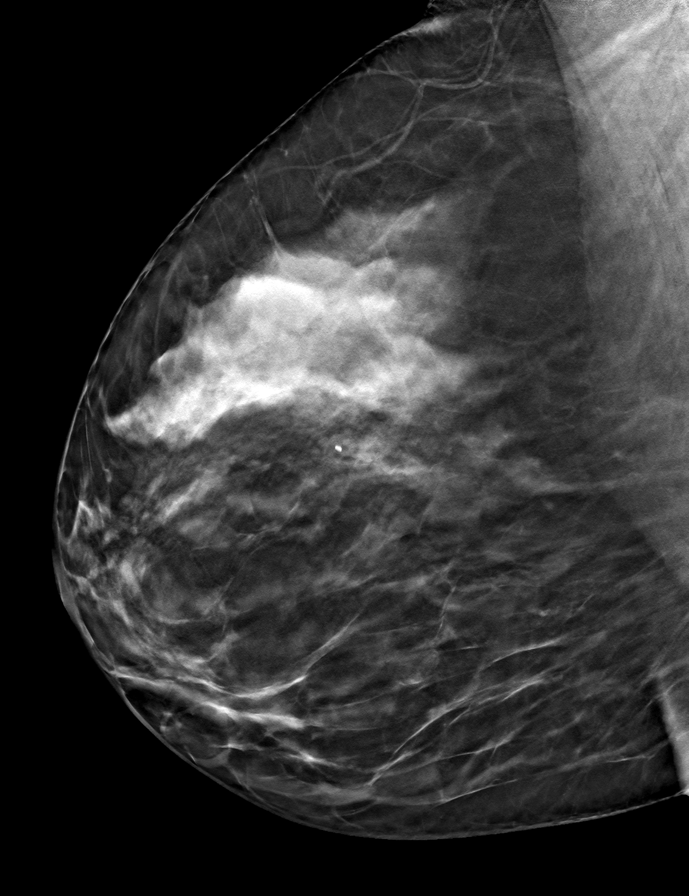

[R CC tomo · tomo slice 35/69.0]
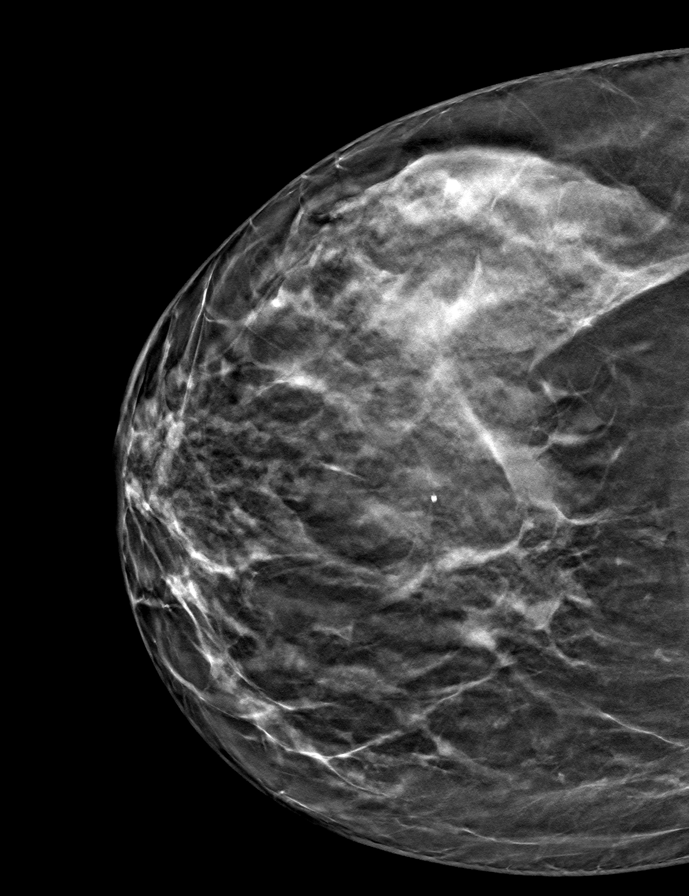

[9 of 24 positions shown; findings below may reference images not displayed]

ACR Breast Density Category c: The breast tissue is heterogeneously
dense, which may obscure small masses.
FINDINGS: There are no findings suspicious for malignancy. Images were
processed with CAD.
IMPRESSION: No mammographic evidence of malignancy. A result letter of this
screening mammogram will be mailed directly to the patient.

RECOMMENDATION:
Screening mammogram in one year. (Code:[id number])

BI-RADS CATEGORY  1: Negative.

## 2020-10-10 DIAGNOSIS — M9905 Segmental and somatic dysfunction of pelvic region: Secondary | ICD-10-CM | POA: Diagnosis not present

## 2020-10-10 DIAGNOSIS — M9903 Segmental and somatic dysfunction of lumbar region: Secondary | ICD-10-CM | POA: Diagnosis not present

## 2020-10-10 DIAGNOSIS — M9902 Segmental and somatic dysfunction of thoracic region: Secondary | ICD-10-CM | POA: Diagnosis not present

## 2020-10-10 DIAGNOSIS — M9901 Segmental and somatic dysfunction of cervical region: Secondary | ICD-10-CM | POA: Diagnosis not present

## 2020-11-08 DIAGNOSIS — J3489 Other specified disorders of nose and nasal sinuses: Secondary | ICD-10-CM | POA: Diagnosis not present

## 2020-11-08 DIAGNOSIS — E039 Hypothyroidism, unspecified: Secondary | ICD-10-CM | POA: Diagnosis not present

## 2020-11-08 DIAGNOSIS — F418 Other specified anxiety disorders: Secondary | ICD-10-CM | POA: Diagnosis not present

## 2020-11-08 DIAGNOSIS — J342 Deviated nasal septum: Secondary | ICD-10-CM | POA: Diagnosis not present

## 2022-04-01 ENCOUNTER — Other Ambulatory Visit: Payer: Self-pay | Admitting: Nurse Practitioner

## 2022-04-01 ENCOUNTER — Ambulatory Visit
Admission: RE | Admit: 2022-04-01 | Discharge: 2022-04-01 | Disposition: A | Discharge status: Home / Self Care | Source: Ambulatory Visit | Attending: Nurse Practitioner | Admitting: Nurse Practitioner

## 2022-04-01 ENCOUNTER — Ambulatory Visit
Admission: RE | Admit: 2022-04-01 | Discharge: 2022-04-01 | Disposition: A | Discharge status: Home / Self Care | Payer: 59 | Source: Ambulatory Visit | Attending: Nurse Practitioner | Admitting: Nurse Practitioner

## 2022-04-01 DIAGNOSIS — N6310 Unspecified lump in the right breast, unspecified quadrant: Secondary | ICD-10-CM

## 2022-04-03 ENCOUNTER — Ambulatory Visit
Admission: RE | Admit: 2022-04-03 | Discharge: 2022-04-03 | Disposition: A | Discharge status: Home / Self Care | Source: Ambulatory Visit | Attending: Nurse Practitioner | Admitting: Nurse Practitioner

## 2022-04-03 DIAGNOSIS — N6310 Unspecified lump in the right breast, unspecified quadrant: Secondary | ICD-10-CM

## 2022-04-08 ENCOUNTER — Telehealth: Payer: Self-pay | Admitting: Hematology and Oncology

## 2022-04-08 NOTE — Telephone Encounter (Signed)
Spoke to patient to confirm afternoon clinic appointment for 8/30, paperwork sent via e-mail

## 2022-04-13 ENCOUNTER — Encounter: Payer: Self-pay | Admitting: *Deleted

## 2022-04-13 DIAGNOSIS — C50411 Malignant neoplasm of upper-outer quadrant of right female breast: Secondary | ICD-10-CM | POA: Insufficient documentation

## 2022-04-13 DIAGNOSIS — Z170 Estrogen receptor positive status [ER+]: Secondary | ICD-10-CM

## 2022-04-14 NOTE — Progress Notes (Signed)
Radiation Oncology         [phone] ________________________________  Multidisciplinary Breast Oncology Clinic [facility]) Initial Outpatient Consultation  Name: Colleen Montgomery MRN: [id number]  Date: 04/15/2022  DOB: [birth year]  CC:Pcp, No  Coralie Keens, MD   REFERRING PHYSICIAN: Coralie Keens, MD  DIAGNOSIS: There were no encounter diagnoses.  Stage *** Right Breast UOQ, Invasive Ductal Carcinoma, ER+ / PR- / Her2-, Grade 3  No diagnosis found.  HISTORY OF PRESENT ILLNESS::Colleen Montgomery is a 56 y.o. female who is presenting to the office today for evaluation of her newly diagnosed breast cancer. She is accompanied by ***. She is doing well overall.   The patient presented with a palpable right breast lump. She underwent unilateral right breast diagnostic mammography with tomography and right breast ultrasonography at [facility] on 04/01/22 showing: a highly suspicious mass in the 11 o'clock right breast, 7 cmfn, measuring 1.9 x 1.6 x 1.8 cm, with internal vascularity. Ultrasound also showed a single enlarged / morphologically abnormal lymph node within the right axilla.  Biopsy of the 11 o'clock right breast on 04/03/22 showed: grade 3 invasive ductal carcinoma measuring 0.6 cm in the greatest linear extent, positive for LVI and focal necrosis. Biopsy of the abnormal right axillary lymph node positive for invasive carcinoma involving fibroadipose tissue. Prognostic indicators significant for: estrogen receptor, 60% positive with weak-moderate staining intensity and progesterone receptor, 0% negative. Proliferation marker Ki67 at 60%. HER2 negative.  Menarche: *** years old Age at first live birth: *** years old GP: *** LMP: *** Contraceptive: *** HRT: ***   The patient was referred today for presentation in the multidisciplinary conference.  Radiology studies and pathology slides were presented there for review and discussion of treatment options.  A consensus  was discussed regarding potential next steps.  PREVIOUS RADIATION THERAPY: {EXAM; YES/NO:19492::"No"}  PAST MEDICAL HISTORY:  Past Medical History:  Diagnosis Date   Diverticulosis    Thyroid disease     PAST SURGICAL HISTORY:No past surgical history on file.  FAMILY HISTORY:  Family History  Problem Relation Age of Onset   Breast cancer Maternal Grandmother     SOCIAL HISTORY:  Social History   Socioeconomic History   Marital status: Married    Spouse name: Not on file   Number of children: Not on file   Years of education: Not on file   Highest education level: Not on file  Occupational History   Not on file  Tobacco Use   Smoking status: Never   Smokeless tobacco: Never  Substance and Sexual Activity   Alcohol use: Yes   Drug use: Not Currently   Sexual activity: Not on file  Other Topics Concern   Not on file  Social History Narrative   Not on file   Social Determinants of Health   Financial Resource Strain: Not on file  Food Insecurity: Not on file  Transportation Needs: Not on file  Physical Activity: Not on file  Stress: Not on file  Social Connections: Not on file    ALLERGIES: No Known Allergies  MEDICATIONS:  Current Outpatient Medications  Medication Sig Dispense Refill   acetaminophen (TYLENOL) 325 MG tablet Take 650 mg by mouth every 6 (six) hours as needed for moderate pain.     dicyclomine (BENTYL) 20 MG tablet Take 1 tablet (20 mg total) by mouth 2 (two) times daily. 20 tablet 0   gabapentin (NEURONTIN) 100 MG capsule Take 2 capsules (200 mg total) at bedtime by mouth. (  Patient not taking: Reported on 02/12/2018) 60 capsule 3   levothyroxine (SYNTHROID, LEVOTHROID) 75 MCG tablet Take 75 mcg by mouth daily before breakfast.     No current facility-administered medications for this encounter.    REVIEW OF SYSTEMS: A 10+ POINT REVIEW OF SYSTEMS WAS OBTAINED including neurology, dermatology, psychiatry, cardiac, respiratory, lymph,  extremities, GI, GU, musculoskeletal, constitutional, reproductive, HEENT. On the provided form, she reports ***. She denies *** and any other symptoms.    PHYSICAL EXAM:  vitals were not taken for this visit.  {may need to copy over vitals} Lungs are clear to auscultation bilaterally. Heart has regular rate and rhythm. No palpable cervical, supraclavicular, or axillary adenopathy. Abdomen soft, non-tender, normal bowel sounds. Breast: *** breast with no palpable mass, nipple discharge, or bleeding. *** breast with ***.   KPS = ***  100 - Normal; no complaints; no evidence of disease. 90   - Able to carry on normal activity; minor signs or symptoms of disease. 80   - Normal activity with effort; some signs or symptoms of disease. 53   - Cares for self; unable to carry on normal activity or to do active work. 60   - Requires occasional assistance, but is able to care for most of his personal needs. 50   - Requires considerable assistance and frequent medical care. 67   - Disabled; requires special care and assistance. 55   - Severely disabled; hospital admission is indicated although death not imminent. 47   - Very sick; hospital admission necessary; active supportive treatment necessary. 10   - Moribund; fatal processes progressing rapidly. 0     - Dead  Karnofsky DA, Abelmann Arial, Craver LS and Burchenal [facility] [phone]) The use of the nitrogen mustards in the palliative treatment of carcinoma: with particular reference to bronchogenic carcinoma Cancer 1 [id number]  LABORATORY DATA:  Lab Results  Component Value Date   WBC 4.8 05/27/2019   HGB 13.2 05/27/2019   HCT 41.4 05/27/2019   MCV 89.2 05/27/2019   PLT 245 05/27/2019   Lab Results  Component Value Date   NA 140 05/27/2019   K 3.6 05/27/2019   CL 106 05/27/2019   CO2 25 05/27/2019   Lab Results  Component Value Date   ALT 19 05/27/2019   AST 19 05/27/2019   ALKPHOS 69 05/27/2019   BILITOT 0.6 05/27/2019    PULMONARY FUNCTION  TEST:   Review Flowsheet        No data to display          RADIOGRAPHY: [place] RT BREAST BX W LOC DEV 1ST LESION IMG BX SPEC US GUIDE  Addendum Date: 04/13/2022   ADDENDUM REPORT: 04/13/2022 09:06 ADDENDUM: Pathology revealed GRADE 3 INVASIVE DUCTAL CARCINOMA. LYMPHOVASCULAR INVASION: PRESENT, FOCAL NECROSIS of the RIGHT breast, 11 o'clock, 7 cmfn (ribbon clip). This was found to be concordant by Dr. Lillia Mountain. Pathology revealed INVASIVE CARCINOMA INVOLVING FIBROADIPOSE TISSUE of RIGHT axillary lymph node (hydromark clip). This was found to be concordant by Dr. Lillia Mountain. Pathology results were discussed with the patient by telephone. The patient reported doing well after the biopsies with tenderness at the sites. Post biopsy instructions and care were reviewed and questions were answered. The patient was encouraged to call [facility] for any additional concerns. The patient was referred to [facility] at [facility] on April 15, 2022. Pathology results reported by Stacie Acres RN on 04/06/2022. Electronically  Signed   By: Lillia Mountain M.D.   On: 04/13/2022 09:06   Result Date: 04/13/2022 CLINICAL DATA:  Suspicious right breast mass and axillary lymph node. EXAM: ULTRASOUND GUIDED RIGHT BREAST CORE NEEDLE BIOPSIES COMPARISON:  Previous exam(s). PROCEDURE: I met with the patient and we discussed the procedure of ultrasound-guided biopsy, including benefits and alternatives. We discussed the high likelihood of a successful procedure. We discussed the risks of the procedure, including infection, bleeding, tissue injury, clip migration, and inadequate sampling. Informed written consent was given. The usual time-out protocol was performed immediately prior to the procedure. Lesion quadrant: Upper-outer quadrant Using sterile technique and 1% Lidocaine as local anesthetic, under direct ultrasound visualization, a 14 gauge  spring-loaded device was used to perform biopsy of a mass in the 11 o'clock region of the right breast 7 cm from the nipple using a lateral to medial approach. At the conclusion of the procedure ribbon shaped tissue marker clip was deployed into the biopsy cavity. Follow up 2 view mammogram was performed and dictated separately. Lesion quadrant: Right axilla Using sterile technique and 1% lidocaine and 1% lidocaine with epinephrine as local anesthetic, under direct ultrasound visualization, a 14 gauge spring-loaded device was used to perform biopsy of an enlarged right axillary lymph node using a lateral to medial approach. At the conclusion of the procedure spiral [facility] shaped tissue marker clip was deployed into the biopsy cavity. Follow up 2 view mammogram was performed and dictated separately. IMPRESSION: Status post ultrasound-guided core biopsies of a mass in the upper-outer quadrant of the right breast and a right axillary lymph node. No apparent complications. Electronically Signed: By: Lillia Mountain M.D. On: 04/03/2022 15:41  [place] AXILLARY NODE CORE BIOPSY RIGHT  Addendum Date: 04/13/2022   ADDENDUM REPORT: 04/13/2022 09:06 ADDENDUM: Pathology revealed GRADE 3 INVASIVE DUCTAL CARCINOMA. LYMPHOVASCULAR INVASION: PRESENT, FOCAL NECROSIS of the RIGHT breast, 11 o'clock, 7 cmfn (ribbon clip). This was found to be concordant by Dr. Lillia Mountain. Pathology revealed INVASIVE CARCINOMA INVOLVING FIBROADIPOSE TISSUE of RIGHT axillary lymph node (hydromark clip). This was found to be concordant by Dr. Lillia Mountain. Pathology results were discussed with the patient by telephone. The patient reported doing well after the biopsies with tenderness at the sites. Post biopsy instructions and care were reviewed and questions were answered. The patient was encouraged to call [facility] for any additional concerns. The patient was referred to [facility] at  [facility] on April 15, 2022. Pathology results reported by Stacie Acres RN on 04/06/2022. Electronically Signed   By: Lillia Mountain M.D.   On: 04/13/2022 09:06   Result Date: 04/13/2022 CLINICAL DATA:  Suspicious right breast mass and axillary lymph node. EXAM: ULTRASOUND GUIDED RIGHT BREAST CORE NEEDLE BIOPSIES COMPARISON:  Previous exam(s). PROCEDURE: I met with the patient and we discussed the procedure of ultrasound-guided biopsy, including benefits and alternatives. We discussed the high likelihood of a successful procedure. We discussed the risks of the procedure, including infection, bleeding, tissue injury, clip migration, and inadequate sampling. Informed written consent was given. The usual time-out protocol was performed immediately prior to the procedure. Lesion quadrant: Upper-outer quadrant Using sterile technique and 1% Lidocaine as local anesthetic, under direct ultrasound visualization, a 14 gauge spring-loaded device was used to perform biopsy of a mass in the 11 o'clock region of the right breast 7 cm from the nipple using a lateral to medial approach. At the conclusion of the procedure  ribbon shaped tissue marker clip was deployed into the biopsy cavity. Follow up 2 view mammogram was performed and dictated separately. Lesion quadrant: Right axilla Using sterile technique and 1% lidocaine and 1% lidocaine with epinephrine as local anesthetic, under direct ultrasound visualization, a 14 gauge spring-loaded device was used to perform biopsy of an enlarged right axillary lymph node using a lateral to medial approach. At the conclusion of the procedure spiral [facility] shaped tissue marker clip was deployed into the biopsy cavity. Follow up 2 view mammogram was performed and dictated separately. IMPRESSION: Status post ultrasound-guided core biopsies of a mass in the upper-outer quadrant of the right breast and a right axillary lymph node. No apparent complications.  Electronically Signed: By: Lillia Mountain M.D. On: 04/03/2022 15:41  MM CLIP PLACEMENT RIGHT  Result Date: 04/03/2022 CLINICAL DATA:  Status post ultrasound-guided core biopsy of a mass in the upper-outer quadrant of the right breast and a right axillary lymph node. EXAM: 3D DIAGNOSTIC RIGHT MAMMOGRAM POST ULTRASOUND BIOPSIES COMPARISON:  Previous exam(s). FINDINGS: 3D Mammographic images were obtained following ultrasound-guided core biopsies of a mass in the upper-outer quadrant of the right breast and a right axillary lymph node. IMPRESSION: Appropriate positioning of the ribbon shaped clip in the upper-outer quadrant of the right breast and spiral shaped HydroMARK clip in the right axilla. Final Assessment: Post Procedure Mammograms for Marker Placement Electronically Signed   By: Lillia Mountain M.D.   On: 04/03/2022 15:57  MM DIAG BREAST TOMO UNI RIGHT  Result Date: 04/01/2022 CLINICAL DATA:  Patient describes a palpable lump within the RIGHT breast. EXAM: DIGITAL DIAGNOSTIC UNILATERAL RIGHT MAMMOGRAM WITH TOMOSYNTHESIS; ULTRASOUND RIGHT BREAST LIMITED TECHNIQUE: Right digital diagnostic mammography and breast tomosynthesis was performed.; Targeted ultrasound examination of the right breast was performed COMPARISON:  Previous exam(s). ACR Breast Density Category c: The breast tissue is heterogeneously dense, which may obscure small masses. FINDINGS: There is a partially obscured mass with associated architectural distortion in the upper-outer quadrant of the RIGHT breast, at posterior depth, corresponding to the palpable area of concern with overlying skin marker in place. There are no new dominant masses, suspicious calcifications or secondary signs of malignancy elsewhere within the RIGHT breast. Targeted ultrasound is performed, showing an irregular hypoechoic mass in the RIGHT breast at the 11 o'clock axis, 7 cm from the nipple, with irregular and indistinct margins, measuring 1.9 x 1.6 x 1.8 cm, with  internal vascularity, corresponding to the mammographic finding. There is a single enlarged/morphologically abnormal lymph node in the RIGHT axilla. IMPRESSION: 1. Highly suspicious mass in the RIGHT breast at the 11 o'clock axis, 7 cm from the nipple, measuring 1.9 cm, with internal vascularity, corresponding to the mammographic finding. Ultrasound-guided biopsy is recommended. 2. Single enlarged/morphologically abnormal lymph node within the RIGHT axilla. Ultrasound-guided biopsy is recommended. RECOMMENDATION: 1. Ultrasound-guided biopsy for the highly suspicious mass in the RIGHT breast at the 11 o'clock axis. 2. Ultrasound-guided biopsy for the single enlarged/morphologically abnormal lymph node in the RIGHT axilla. Ultrasound-guided biopsies are scheduled on August 18th. I have discussed the findings and recommendations with the patient. If applicable, a reminder letter will be sent to the patient regarding the next appointment. BI-RADS CATEGORY  5: Highly suggestive of malignancy. Electronically Signed   By: Franki Cabot M.D.   On: 04/01/2022 13:45  US BREAST LTD UNI RIGHT INC AXILLA  Result Date: 04/01/2022 CLINICAL DATA:  Patient describes a palpable lump within the RIGHT breast. EXAM: DIGITAL DIAGNOSTIC UNILATERAL RIGHT MAMMOGRAM WITH TOMOSYNTHESIS;  ULTRASOUND RIGHT BREAST LIMITED TECHNIQUE: Right digital diagnostic mammography and breast tomosynthesis was performed.; Targeted ultrasound examination of the right breast was performed COMPARISON:  Previous exam(s). ACR Breast Density Category c: The breast tissue is heterogeneously dense, which may obscure small masses. FINDINGS: There is a partially obscured mass with associated architectural distortion in the upper-outer quadrant of the RIGHT breast, at posterior depth, corresponding to the palpable area of concern with overlying skin marker in place. There are no new dominant masses, suspicious calcifications or secondary signs of malignancy  elsewhere within the RIGHT breast. Targeted ultrasound is performed, showing an irregular hypoechoic mass in the RIGHT breast at the 11 o'clock axis, 7 cm from the nipple, with irregular and indistinct margins, measuring 1.9 x 1.6 x 1.8 cm, with internal vascularity, corresponding to the mammographic finding. There is a single enlarged/morphologically abnormal lymph node in the RIGHT axilla. IMPRESSION: 1. Highly suspicious mass in the RIGHT breast at the 11 o'clock axis, 7 cm from the nipple, measuring 1.9 cm, with internal vascularity, corresponding to the mammographic finding. Ultrasound-guided biopsy is recommended. 2. Single enlarged/morphologically abnormal lymph node within the RIGHT axilla. Ultrasound-guided biopsy is recommended. RECOMMENDATION: 1. Ultrasound-guided biopsy for the highly suspicious mass in the RIGHT breast at the 11 o'clock axis. 2. Ultrasound-guided biopsy for the single enlarged/morphologically abnormal lymph node in the RIGHT axilla. Ultrasound-guided biopsies are scheduled on August 18th. I have discussed the findings and recommendations with the patient. If applicable, a reminder letter will be sent to the patient regarding the next appointment. BI-RADS CATEGORY  5: Highly suggestive of malignancy. Electronically Signed   By: Franki Cabot M.D.   On: 04/01/2022 13:45     IMPRESSION: ***   Patient will be a good candidate for breast conservation with radiotherapy to the right breast. We discussed the general course of radiation, potential side effects, and toxicities with radiation and the patient is interested in this approach. ***   PLAN:  ***    ------------------------------------------------  Blair Promise, PhD, MD  This document serves as a record of services personally performed by Gery Pray, MD. It was created on his behalf by Roney Mans, a trained medical scribe. The creation of this record is based on the scribe's personal observations and the provider's  statements to them. This document has been checked and approved by the attending provider.

## 2022-04-15 ENCOUNTER — Encounter: Payer: Self-pay | Admitting: Physical Therapy

## 2022-04-15 ENCOUNTER — Ambulatory Visit
Admission: RE | Admit: 2022-04-15 | Discharge: 2022-04-15 | Disposition: A | Discharge status: Home / Self Care | Source: Ambulatory Visit | Attending: Radiation Oncology | Admitting: Radiation Oncology

## 2022-04-15 ENCOUNTER — Inpatient Hospital Stay (HOSPITAL_BASED_OUTPATIENT_CLINIC_OR_DEPARTMENT_OTHER): Admitting: Genetic Counselor

## 2022-04-15 ENCOUNTER — Other Ambulatory Visit: Payer: Self-pay

## 2022-04-15 ENCOUNTER — Inpatient Hospital Stay: Attending: Hematology and Oncology

## 2022-04-15 ENCOUNTER — Inpatient Hospital Stay

## 2022-04-15 ENCOUNTER — Ambulatory Visit: Attending: Surgery | Admitting: Physical Therapy

## 2022-04-15 ENCOUNTER — Telehealth: Payer: Self-pay | Admitting: *Deleted

## 2022-04-15 ENCOUNTER — Encounter: Payer: Self-pay | Admitting: General Practice

## 2022-04-15 ENCOUNTER — Inpatient Hospital Stay (HOSPITAL_BASED_OUTPATIENT_CLINIC_OR_DEPARTMENT_OTHER): Admitting: Hematology and Oncology

## 2022-04-15 ENCOUNTER — Encounter: Payer: Self-pay | Admitting: Hematology and Oncology

## 2022-04-15 ENCOUNTER — Other Ambulatory Visit: Payer: Self-pay | Admitting: Surgery

## 2022-04-15 ENCOUNTER — Other Ambulatory Visit: Payer: Self-pay | Admitting: *Deleted

## 2022-04-15 ENCOUNTER — Encounter: Payer: Self-pay | Admitting: Radiology

## 2022-04-15 DIAGNOSIS — Z170 Estrogen receptor positive status [ER+]: Secondary | ICD-10-CM

## 2022-04-15 DIAGNOSIS — Z8042 Family history of malignant neoplasm of prostate: Secondary | ICD-10-CM | POA: Diagnosis not present

## 2022-04-15 DIAGNOSIS — R293 Abnormal posture: Secondary | ICD-10-CM | POA: Diagnosis present

## 2022-04-15 DIAGNOSIS — C50911 Malignant neoplasm of unspecified site of right female breast: Secondary | ICD-10-CM

## 2022-04-15 DIAGNOSIS — C50411 Malignant neoplasm of upper-outer quadrant of right female breast: Secondary | ICD-10-CM | POA: Insufficient documentation

## 2022-04-15 DIAGNOSIS — Z800 Family history of malignant neoplasm of digestive organs: Secondary | ICD-10-CM

## 2022-04-15 DIAGNOSIS — Z803 Family history of malignant neoplasm of breast: Secondary | ICD-10-CM | POA: Diagnosis not present

## 2022-04-15 LAB — CBC WITH DIFFERENTIAL (CANCER CENTER ONLY)
Abs Immature Granulocytes: 0.01 10*3/uL (ref 0.00–0.07)
Basophils Absolute: 0 10*3/uL (ref 0.0–0.1)
Basophils Relative: 0 %
Eosinophils Absolute: 0.1 10*3/uL (ref 0.0–0.5)
Eosinophils Relative: 1 %
HCT: 37.2 % (ref 36.0–46.0)
Hemoglobin: 12.6 g/dL (ref 12.0–15.0)
Immature Granulocytes: 0 %
Lymphocytes Relative: 26 %
Lymphs Abs: 1.2 10*3/uL (ref 0.7–4.0)
MCH: 28.9 pg (ref 26.0–34.0)
MCHC: 33.9 g/dL (ref 30.0–36.0)
MCV: 85.3 fL (ref 80.0–100.0)
Monocytes Absolute: 0.4 10*3/uL (ref 0.1–1.0)
Monocytes Relative: 8 %
Neutro Abs: 2.9 10*3/uL (ref 1.7–7.7)
Neutrophils Relative %: 65 %
Platelet Count: 235 10*3/uL (ref 150–400)
RBC: 4.36 MIL/uL (ref 3.87–5.11)
RDW: 12.8 % (ref 11.5–15.5)
WBC Count: 4.5 10*3/uL (ref 4.0–10.5)
nRBC: 0 % (ref 0.0–0.2)

## 2022-04-15 LAB — CMP (CANCER CENTER ONLY)
ALT: 16 U/L (ref 0–44)
AST: 18 U/L (ref 15–41)
Albumin: 4.7 g/dL (ref 3.5–5.0)
Alkaline Phosphatase: 61 U/L (ref 38–126)
Anion gap: 6 (ref 5–15)
BUN: 12 mg/dL (ref 6–20)
CO2: 28 mmol/L (ref 22–32)
Calcium: 9.9 mg/dL (ref 8.9–10.3)
Chloride: 106 mmol/L (ref 98–111)
Creatinine: 0.92 mg/dL (ref 0.44–1.00)
GFR, Estimated: >60 mL/min (ref >60)
Glucose, Bld: 83 mg/dL (ref 70–99)
Potassium: 3.9 mmol/L (ref 3.5–5.1)
Sodium: 140 mmol/L (ref 135–145)
Total Bilirubin: 0.5 mg/dL (ref 0.3–1.2)
Total Protein: 7.1 g/dL (ref 6.5–8.1)

## 2022-04-15 LAB — GENETIC SCREENING ORDER

## 2022-04-15 NOTE — Assessment & Plan Note (Signed)
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  Pathology and radiology counseling:Discussed with the patient, the details of pathology including the type of breast cancer,the clinical staging, the significance of ER, PR and HER-2/neu receptors and the implications for treatment. After reviewing the pathology in detail, we proceeded to discuss the different treatment options between surgery, radiation, chemotherapy, antiestrogen therapies.  Recommendations: 1. Breast conserving surgery followed by 2. Oncotype DX testing to determine if chemotherapy would be of any benefit followed by 3. Adjuvant radiation therapy followed by 4. Adjuvant antiestrogen therapy  Oncotype counseling: I discussed Oncotype DX test. I explained to the patient that this is a 21 gene panel to evaluate patient tumors DNA to calculate recurrence score. This would help determine whether patient has high risk or low risk breast cancer. She understands that if her tumor was found to be high risk, she would benefit from systemic chemotherapy. If low risk, no need of chemotherapy.  Return to clinic after surgery to discuss final pathology report and then determine if Oncotype DX testing will need to be sent.

## 2022-04-15 NOTE — Therapy (Signed)
OUTPATIENT PHYSICAL THERAPY BREAST CANCER BASELINE EVALUATION   Patient Name: Colleen Montgomery MRN: [id number] DOB:1966/05/23, 56 y.o., female Today's Date: 04/15/2022   PT End of Session - 04/15/22 1306     Visit Number 1    Number of Visits 2    Date for PT Re-Evaluation 06/10/22    PT Start Time 8469    PT Stop Time 1440    PT Time Calculation (min) 45 min    Activity Tolerance Patient tolerated treatment well    Behavior During Therapy [facility] for tasks assessed/performed             Past Medical History:  Diagnosis Date   Breast cancer ([place])    Diverticulosis    Thyroid disease    Past Surgical History:  Procedure Laterality Date   APPENDECTOMY     Patient Active Problem List   Diagnosis Date Noted   Malignant neoplasm of upper-outer quadrant of right breast in female, estrogen receptor positive ([place]) 04/13/2022   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 Coralie Keens  REFERRING DIAG: Right breast cancer  THERAPY DIAG:  Malignant neoplasm of upper-outer quadrant of right breast in female, estrogen receptor positive ([place])  Abnormal posture  Rationale for Evaluation and Treatment Rehabilitation  ONSET DATE: 04/03/2022  SUBJECTIVE                                                                                                                                                                                           SUBJECTIVE STATEMENT: Patient reports she is here today to be seen by her medical team for her newly diagnosed right breast cancer.   PERTINENT HISTORY:  Patient was diagnosed on 04/03/2022 with right 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 It measures 1.9 cm and is located  in the upper outer quadrant. It is ER positive, PR negative and HER2 negative with a Ki67 of 60%. She has a positive axillary lymph node.  PATIENT GOALS   reduce lymphedema risk and learn post op HEP.   PAIN:  Are you having pain? No   PRECAUTIONS: Active CA   HAND DOMINANCE: right  WEIGHT BEARING RESTRICTIONS No  FALLS:  Has patient fallen in last 6 months? No  LIVING ENVIRONMENT: Patient lives with: alone  Lives in: House/apartment Has following equipment at home: None  OCCUPATION: Nurse for a [employer]: No  specific exercise except she regularly walks her very active dog who is part [place].   PRIOR LEVEL OF FUNCTION: Independent   OBJECTIVE  COGNITION:  Overall cognitive status: Within functional limits for tasks assessed    POSTURE:  Forward head and rounded shoulders posture  UPPER EXTREMITY AROM/PROM:  A/PROM RIGHT   eval   Shoulder extension 50  Shoulder flexion 148  Shoulder abduction 175  Shoulder internal rotation 63  Shoulder external rotation 90    (Blank rows = not tested)  A/PROM LEFT   eval  Shoulder extension 86  Shoulder flexion 153  Shoulder abduction 177  Shoulder internal rotation 57  Shoulder external rotation 87    (Blank rows = not tested)   CERVICAL AROM: All within normal limits  UPPER EXTREMITY STRENGTH: WNL   LYMPHEDEMA ASSESSMENTS:   LANDMARK RIGHT   eval  10 cm proximal to olecranon process 28.4  Olecranon process 24.2  10 cm proximal to ulnar styloid process 21.2  Just proximal to ulnar styloid process 14.6  Across hand at thumb web space 17.5  At base of 2nd digit 6.2  (Blank rows = not tested)  LANDMARK LEFT   eval  10 cm proximal to olecranon process 27.5  Olecranon process 24.2  10 cm proximal to ulnar styloid process 20  Just proximal to ulnar styloid process 14.7  Across hand at thumb web space 17.5  At base of 2nd digit 6.2  (Blank rows = not tested)   L-DEX  LYMPHEDEMA SCREENING:  The patient was assessed using the L-Dex machine today to produce a lymphedema index baseline score. The patient will be reassessed on a regular basis (typically every 3 months) to obtain new L-Dex scores. If the score is > 6.5 points away from his/her baseline score indicating onset of subclinical lymphedema, it will be recommended to wear a compression garment for 4 weeks, 12 hours per day and then be reassessed. If the score continues to be > 6.5 points from baseline at reassessment, we will initiate lymphedema treatment. Assessing in this manner has a 95% rate of preventing clinically significant lymphedema.   L-DEX FLOWSHEETS - 04/15/22 1300       L-DEX LYMPHEDEMA SCREENING   Measurement Type Unilateral    L-DEX MEASUREMENT EXTREMITY Upper Extremity    POSITION  Standing    DOMINANT SIDE Right    At Risk Side Right    BASELINE SCORE (UNILATERAL) 0.5              QUICK DASH SURVEY:  Katina Dung - 04/15/22 0001     Open a tight or new jar No difficulty    Do heavy household chores (wash walls, wash floors) No difficulty    Carry a shopping bag or briefcase No difficulty    Wash your back No difficulty    Use a knife to cut food No difficulty    Recreational activities in which you take some force or impact through your arm, shoulder, or hand (golf, hammering, tennis) No difficulty    During the past week, to what extent has your arm, shoulder or hand problem interfered with your normal social activities with family, friends, neighbors, or groups? Not at all    During the past week, to what extent has your arm, shoulder or hand problem limited your work or other regular daily activities Not at all    Arm, shoulder, or hand pain. None    Tingling (pins and needles) in your arm, shoulder,  or hand None    Difficulty Sleeping No difficulty    DASH Score 0 %              PATIENT EDUCATION:  Education details: Lymphedema risk reduction and post op  shoulder/posture HEP Person educated: Patient Education method: Explanation, Demonstration, Handout Education comprehension: Patient verbalized understanding and returned demonstration   HOME EXERCISE PROGRAM: Patient was instructed today in a home exercise program today for post op shoulder range of motion. These included active assist shoulder flexion in sitting, scapular retraction, wall walking with shoulder abduction, and hands behind head external rotation.  She was encouraged to do these twice a day, holding 3 seconds and repeating 5 times when permitted by her physician.   ASSESSMENT:  CLINICAL IMPRESSION: Patient was diagnosed on 04/03/2022 with right 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 It measures 1.9 cm and is located in the upper outer quadrant. It is ER positive, PR negative and HER2 negative with a Ki67 of 60%. She has a positive axillary lymph node.Her multidisciplinary medical team met prior to her assessments to determine a recommended treatment plan. She is planning to have a right lumpectomy and targeted axillary lymph node dissection followed by Oncotype testing, radiation, and anti-estrogen therapy. She will benefit from a post op PT reassessment to determine needs and from L-Dex screens every 3 months for 2 years to detect subclinical lymphedema.  Pt will benefit from skilled therapeutic intervention to improve on the following deficits: Decreased knowledge of precautions, impaired UE functional use, pain, decreased ROM, postural dysfunction.   PT treatment/interventions: ADL/self-care home management, pt/family education, therapeutic exercise  REHAB POTENTIAL: Excellent  CLINICAL DECISION MAKING: Stable/uncomplicated  EVALUATION COMPLEXITY: Low   GOALS: Goals reviewed with patient? YES  LONG TERM GOALS: (STG=LTG)    Name Target Date Goal status  1 Pt will be able to verbalize understanding of pertinent lymphedema risk reduction practices relevant to  her dx specifically related to skin care.  Baseline:  No knowledge 04/15/2022 Achieved at eval  2 Pt will be able to return demo and/or verbalize understanding of the post op HEP related to regaining shoulder ROM. Baseline:  No knowledge 04/15/2022 Achieved at eval  3 Pt will be able to verbalize understanding of the importance of attending the post op After Breast CA Class for further lymphedema risk reduction education and therapeutic exercise.  Baseline:  No knowledge 04/15/2022 Achieved at eval  4 Pt will demo she has regained full shoulder ROM and function post operatively compared to baselines.  Baseline: See objective measurements taken today. 06/10/2022      PLAN: PT FREQUENCY/DURATION: EVAL and 1 follow up appointment.   PLAN FOR NEXT SESSION: will reassess 3-4 weeks post op to determine needs.   Patient will follow up at outpatient cancer rehab 3-4 weeks following surgery.  If the patient requires physical therapy at that time, a specific plan will be dictated and sent to the referring physician for approval. The patient was educated today on appropriate basic range of motion exercises to begin post operatively and the importance of attending the After Breast Cancer class following surgery.  Patient was educated today on lymphedema risk reduction practices as it pertains to recommendations that will benefit the patient immediately following surgery.  She verbalized good understanding.    Physical Therapy Information for After Breast Cancer Surgery/Treatment:  Lymphedema is a swelling condition that you may be at risk for in your arm if you have lymph nodes removed from the armpit  area.  After a sentinel node biopsy, the risk is approximately 5-9% and is higher after an axillary node dissection.  There is treatment available for this condition and it is not life-threatening.  Contact your physician or physical therapist with concerns. You may begin the 4 shoulder/posture exercises (see  additional sheet) when permitted by your physician (typically a week after surgery).  If you have drains, you may need to wait until those are removed before beginning range of motion exercises.  A general recommendation is to not lift your arms above shoulder height until drains are removed.  These exercises should be done to your tolerance and gently.  This is not a "no pain/no gain" type of recovery so listen to your body and stretch into the range of motion that you can tolerate, stopping if you have pain.  If you are having immediate reconstruction, ask your plastic surgeon about doing exercises as he or she may want you to wait. We encourage you to attend the free one time ABC (After Breast Cancer) class offered by [facility].  You will learn information related to lymphedema risk, prevention and treatment and additional exercises to regain mobility following surgery.  You can call [phone] for more information.  This is offered the 1st and 3rd Monday of each month.  You only attend the class one time. While undergoing any medical procedure or treatment, try to avoid blood pressure being taken or needle sticks from occurring on the arm on the side of cancer.   This recommendation begins after surgery and continues for the rest of your life.  This may help reduce your risk of getting lymphedema (swelling in your arm). An excellent resource for those seeking information on lymphedema is the National Lymphedema Network's web site. It can be accessed at [place].org If you notice swelling in your hand, arm or breast at any time following surgery (even if it is many years from now), please contact your doctor or physical therapist to discuss this.  Lymphedema can be treated at any time but it is easier for you if it is treated early on.  If you feel like your shoulder motion is not returning to normal in a reasonable amount of time, please contact your surgeon or physical  therapist.  [facility] [phone]. [address], [address], [place] [address]  ABC CLASS After Breast Cancer Class  After Breast Cancer Class is a specially designed exercise class to assist you in a safe recover after having breast cancer surgery.  In this class you will learn how to get back to full function whether your drains were just removed or if you had surgery a month ago.  This one-time class is held the 1st and 3rd Monday of every month from 11:00 a.m. until 12:00 noon virtually.  This class is FREE and space is limited. For more information or to register for the next available class, call [phone].  Class Goals  Understand specific stretches to improve the flexibility of you chest and shoulder. Learn ways to safely strengthen your upper body and improve your posture. Understand the warning signs of infection and why you may be at risk for an arm infection. Learn about Lymphedema and prevention.  ** You do not attend this class until after surgery.  Drains must be removed to participate  Patient was instructed today in a home exercise program today for post op shoulder range of motion. These included active  assist shoulder flexion in sitting, scapular retraction, wall walking with shoulder abduction, and hands behind head external rotation.  She was encouraged to do these twice a day, holding 3 seconds and repeating 5 times when permitted by her physician.   Annia Friendly, [place] 04/15/22 3:02 PM

## 2022-04-15 NOTE — Telephone Encounter (Signed)
Ordered oncotype (core) per Dr. [name]. Requisition sent to [facility].

## 2022-04-15 NOTE — Progress Notes (Unsigned)
REFERRING PROVIDER: [name], MD  PRIMARY PROVIDER:  Pcp, No  PRIMARY REASON FOR VISIT:  Encounter Diagnoses  Name Primary?   Malignant neoplasm of upper-outer quadrant of right breast in female, estrogen receptor positive ([place]) Yes   Family history of breast cancer    Family history of colon cancer    Family history of prostate cancer in father    HISTORY OF PRESENT ILLNESS:   Colleen Montgomery, a 56 y.o. female, was seen for a [place] cancer genetics consultation during the breast multidisciplinary clinic at the request of Dr. [name] due to a personal and family history of cancer.  Colleen Montgomery presents to clinic today to discuss the possibility of a hereditary predisposition to cancer, to discuss genetic testing, and to further clarify her future cancer risks, as well as potential cancer risks for family members.   In August 2023, at the age of 72, Colleen Montgomery was diagnosed with invasive ductal carcinoma of the right breast (ER+, PR-, HER2-).   CANCER HISTORY:  Oncology History  Malignant neoplasm of upper-outer quadrant of right breast in female, estrogen receptor positive ([place])  04/03/2022 Initial Diagnosis   Palpable right breast mass UOQ, by ultrasound at 11 o'clock position 1.9 cm mass which on biopsy was grade 3 IDC ER 60%, PR 0%, Ki-67 60%, HER2 negative, 1 lymph node in the axilla: Biopsy positive   04/15/2022 Cancer Staging   Staging form: Breast, AJCC 8th Edition - Clinical: Stage IIB (cT1c, cN1, cM0, G3, ER+, PR-, HER2-) - Signed by [name], MD on 04/15/2022 Stage prefix: Initial diagnosis Histologic grading system: 3 grade system      RISK FACTORS:  Menarche was at age 85.  First live birth at age 13.  OCP use for approximately 1 year.  Ovaries intact: yes.  Uterus intact: yes.  Menopausal status: postmenopausal.  HRT use: 0 years. Colonoscopy: yes;  2018 . Mammogram within the last year: yes. Any excessive radiation exposure in the past: no  Past Medical  History:  Diagnosis Montgomery   Breast cancer ([place])    Diverticulosis    Thyroid disease     Past Surgical History:  Procedure Laterality Montgomery   APPENDECTOMY      Social History   Socioeconomic History   Marital status: Married    Spouse name: Not on file   Number of children: Not on file   Years of education: Not on file   Highest education level: Not on file  Occupational History   Not on file  Tobacco Use   Smoking status: Never   Smokeless tobacco: Never  Substance and Sexual Activity   Alcohol use: Yes   Drug use: Not Currently   Sexual activity: Not on file  Other Topics Concern   Not on file  Social History Narrative   Not on file   Social Determinants of Health   Financial Resource Strain: Not on file  Food Insecurity: Not on file  Transportation Needs: Not on file  Physical Activity: Not on file  Stress: Not on file  Social Connections: Not on file     FAMILY HISTORY:  We obtained a detailed, 4-generation family history.  Significant diagnoses are listed below: Family History  Problem Relation Age of Onset   Prostate cancer Father 19       metastatic   Colon cancer Maternal Uncle        dx. 2s/70s   Breast cancer Maternal Grandmother      Colleen Montgomery's maternal  uncle was diagnosed with colon cancer in his 2s/70s. Her maternal aunt was diagnosed with lung cancer in her 12s. Her maternal grandmother was diagnosed with breast cancer at an unknown age, she is deceased. Colleen Montgomery father was diagnosed with prostate cancer at age 82, the prostate cancer later recurred and metastasized and he died due to metastatic prostate cancer at age 61.  Colleen Montgomery is unaware of previous family history of genetic testing for hereditary cancer risks. There is no reported Ashkenazi Jewish ancestry.   GENETIC COUNSELING ASSESSMENT: Colleen Montgomery is a 56 y.o. female with a personal and family history of cancer which is somewhat suggestive of a hereditary cancer syndrome and  predisposition to cancer. We, therefore, discussed and recommended the following at today's visit.   DISCUSSION: We discussed that 5 - 10% of cancer is hereditary, with most cases of hereditary breast cancer associated with mutations in BRCA1/2.  There are other genes that can be associated with hereditary breast cancer syndromes. Type of cancer risk and level of risk are gene-specific. We discussed that testing is beneficial for several reasons including knowing how to follow individuals after completing their treatment, identifying whether potential treatment options would be beneficial, and understanding if other family members could be at risk for cancer and allowing them to undergo genetic testing.   We reviewed the characteristics, features and inheritance patterns of hereditary cancer syndromes. We also discussed genetic testing, including the appropriate family members to test, the process of testing, insurance coverage and turn-around-time for results. We discussed the implications of a negative, positive and/or variant of uncertain significant result. In order to get genetic test results in a timely manner so that Colleen Montgomery can use these genetic test results for surgical decisions, we recommended Colleen Montgomery pursue genetic testing for the BRCAplus. Once complete, we recommend Colleen Montgomery pursue reflex genetic testing to a more comprehensive gene panel.   Colleen Montgomery  was offered a common hereditary cancer panel (47 genes) and an expanded pan-cancer panel (77 genes). Colleen Montgomery was informed of the benefits and limitations of each panel, including that expanded pan-cancer panels contain genes that do not have clear management guidelines at this point in time.  We also discussed that as the number of genes included on a panel increases, the chances of variants of uncertain significance increases.  After considering the benefits and limitations of each gene panel, Colleen Montgomery elected to have [facility]  CancerNext-Expanded Panel.  The CancerNext-Expanded gene panel offered by [facility] and includes sequencing, rearrangement, and RNA analysis for the following 77 genes: AIP, ALK, APC, ATM, AXIN2, BAP1, BARD1, BLM, BMPR1A, BRCA1, BRCA2, BRIP1, CDC73, CDH1, CDK4, CDKN1B, CDKN2A, CHEK2, CTNNA1, DICER1, FANCC, FH, FLCN, GALNT12, KIF1B, LZTR1, MAX, MEN1, MET, MLH1, MSH2, MSH3, MSH6, MUTYH, NBN, NF1, NF2, NTHL1, PALB2, PHOX2B, PMS2, POT1, PRKAR1A, PTCH1, PTEN, RAD51C, RAD51D, RB1, RECQL, RET, SDHA, SDHAF2, SDHB, SDHC, SDHD, SMAD4, SMARCA4, SMARCB1, SMARCE1, STK11, SUFU, TMEM127, TP53, TSC1, TSC2, VHL and XRCC2 (sequencing and deletion/duplication); EGFR, EGLN1, HOXB13, KIT, MITF, PDGFRA, POLD1, and POLE (sequencing only); EPCAM and GREM1 (deletion/duplication only).   Based on Colleen Montgomery's personal and family history of cancer, she meets medical criteria for genetic testing. Despite that she meets criteria, she may still have an out of pocket cost. We discussed that if her out of pocket cost for testing is over $100, the laboratory should contact them to discuss self-pay prices, patient pay assistance programs, if applicable, and other billing options.   PLAN: After considering  the risks, benefits, and limitations, Colleen Montgomery provided informed consent to pursue genetic testing and the blood sample was sent to [facility] for analysis of the CancerNext-Expanded Panel. Results should be available within approximately 1-2 weeks' time, at which point they will be disclosed by telephone to Colleen Montgomery, as will any additional recommendations warranted by these results. Colleen Montgomery will receive a summary of her genetic counseling visit and a copy of her results once available. This information will also be available in Epic.   Colleen Montgomery questions were answered to her satisfaction today. Our contact information was provided should additional questions or concerns arise. Thank you for the referral and allowing [place]  to share in the care of your patient.   Colleen Passy, MS, [facility] Genetic Counselor [email or web address] (P) [phone]  The patient was seen for a total of 20 minutes in face-to-face genetic counseling.  The patient brought her daughter. Drs. [name] and/or Burr Medico were available to discuss this case as needed.  _______________________________________________________________________ For Office Staff:  Number of people involved in session: 2 Was an Intern/ student involved with case: no

## 2022-04-15 NOTE — Progress Notes (Signed)
[facility] NOTE  Patient Care Team: Pcp, No as PCP - General Mauro Kaufmann, RN as Oncology Nurse Navigator Rockwell Germany, RN as Oncology Nurse Navigator Coralie Keens, MD as Consulting Physician (General Surgery) [name], MD as Consulting Physician (Hematology and Oncology) Gery Pray, MD as Consulting Physician (Radiation Oncology)  CHIEF COMPLAINTS/PURPOSE OF CONSULTATION:  Newly diagnosed breast cancer  HISTORY OF PRESENTING ILLNESS:  Colleen Montgomery 56 y.o. female is here because of recent diagnosis of right breast cancer.  Patient felt a palpable lump in the right breast upper outer quadrant at the mammogram and ultrasound revealed mass at 11 o'clock position measuring 1.9 cm.  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  1 axillary lymph node was noted and biopsy was positive.  She was presented at the multidisciplinary tumor board and she is here today to discuss her treatment plan.  I reviewed her records extensively and collaborated the history with the patient.  SUMMARY OF ONCOLOGIC HISTORY: Oncology History  Malignant neoplasm of upper-outer quadrant of right breast in female, estrogen receptor positive ([place])  04/03/2022 Initial Diagnosis   Palpable right breast mass UOQ, by ultrasound at 11 o'clock position 1.9 cm mass which on biopsy was grade 3 IDC ER 60%, PR 0%, Ki-67 60%, HER2 negative, 1 lymph node in the axilla: Biopsy positive   04/15/2022 Cancer Staging   Staging form: Breast, AJCC 8th Edition - Clinical: Stage IIB (cT1c, cN1, cM0, G3, ER+, PR-, HER2-) - Signed by [name], MD on 04/15/2022 Stage prefix: Initial diagnosis Histologic grading system: 3 grade system      MEDICAL HISTORY:  Past Medical History:  Diagnosis Date   Breast cancer ([place])    Diverticulosis    Thyroid disease     SURGICAL HISTORY: Past Surgical History:  Procedure Laterality Date    APPENDECTOMY      SOCIAL HISTORY: Social History   Socioeconomic History   Marital status: Married    Spouse name: Not on file   Number of children: Not on file   Years of education: Not on file   Highest education level: Not on file  Occupational History   Not on file  Tobacco Use   Smoking status: Never   Smokeless tobacco: Never  Substance and Sexual Activity   Alcohol use: Yes   Drug use: Not Currently   Sexual activity: Not on file  Other Topics Concern   Not on file  Social History Narrative   Not on file   Social Determinants of Health   Financial Resource Strain: Not on file  Food Insecurity: Not on file  Transportation Needs: Not on file  Physical Activity: Not on file  Stress: Not on file  Social Connections: Not on file  Intimate Partner Violence: Not on file    FAMILY HISTORY: Family History  Problem Relation Age of Onset   Colon cancer Maternal Uncle    Breast cancer Maternal Grandmother     ALLERGIES:  has No Known Allergies.  MEDICATIONS:  Current Outpatient Medications  Medication Sig Dispense Refill   acetaminophen (TYLENOL) 325 MG tablet Take 650 mg by mouth every 6 (six) hours as needed for moderate pain.     Fluorouracil (TOLAK) 4 % CREA Apply 1 Application topically. Pea sized amount topical, once daily.  3 week rotate area     levothyroxine (SYNTHROID, LEVOTHROID) 75 MCG tablet Take 75 mcg by mouth daily before  breakfast.     dicyclomine (BENTYL) 20 MG tablet Take 1 tablet (20 mg total) by mouth 2 (two) times daily. (Patient not taking: Reported on 04/15/2022) 20 tablet 0   gabapentin (NEURONTIN) 100 MG capsule Take 2 capsules (200 mg total) at bedtime by mouth. (Patient not taking: Reported on 02/12/2018) 60 capsule 3   No current facility-administered medications for this visit.    REVIEW OF SYSTEMS:   Constitutional: Denies fevers, chills or abnormal night sweats Breast:  Denies any palpable lumps or discharge All other systems were  reviewed with the patient and are negative.  PHYSICAL EXAMINATION: ECOG PERFORMANCE STATUS: 1 - Symptomatic but completely ambulatory  Vitals:   04/15/22 1250  BP: 126/82  Pulse: 83  Resp: 18  Temp: (!) 97.3 F (36.3 C)  SpO2: 99%   Filed Weights   04/15/22 1250  Weight: 167 lb (75.8 kg)    GENERAL:alert, no distress and comfortable    LABORATORY DATA:  I have reviewed the data as listed Lab Results  Component Value Date   WBC 4.5 04/15/2022   HGB 12.6 04/15/2022   HCT 37.2 04/15/2022   MCV 85.3 04/15/2022   PLT 235 04/15/2022   Lab Results  Component Value Date   NA 140 04/15/2022   K 3.9 04/15/2022   CL 106 04/15/2022   CO2 28 04/15/2022    RADIOGRAPHIC STUDIES: I have personally reviewed the radiological reports and agreed with the findings in the report.  ASSESSMENT AND PLAN:  Malignant neoplasm of upper-outer quadrant of right breast in female, estrogen receptor positive ([place]) 04/03/2022:Palpable right breast mass UOQ, by ultrasound at 11 o'clock position 1.9 cm mass which on biopsy was grade 3 IDC ER 60%, PR 0%, Ki-67 60%, HER2 negative, 1 lymph node in the axilla: Biopsy positive.  Pathology and radiology counseling:Discussed with the patient, the details of pathology including the type of breast cancer,the clinical staging, the significance of ER, PR and HER-2/neu receptors and the implications for treatment. After reviewing the pathology in detail, we proceeded to discuss the different treatment options between surgery, radiation, chemotherapy, antiestrogen therapies.  Recommendations: 1. Breast conserving surgery followed by 2. Oncotype DX testing to determine if chemotherapy would be of any benefit followed by 3. Adjuvant radiation therapy followed by 4. Adjuvant antiestrogen therapy  Oncotype counseling: I discussed Oncotype DX test. I explained to the patient that this is a 21 gene panel to evaluate patient tumors DNA to calculate recurrence score.  This would help determine whether patient has high risk or low risk breast cancer. She understands that if her tumor was found to be high risk, she would benefit from systemic chemotherapy. If low risk, no need of chemotherapy.  Her daughter has strange erythema and flushing sensation of her arms and chest along with diarrhea.  She is going to get a work-up for this. Return to clinic after surgery to discuss final pathology report and then determine if Oncotype DX testing will need to be sent.    All questions were answered. The patient knows to call the clinic with any problems, questions or concerns.    Harriette Ohara, MD 04/15/22

## 2022-04-15 NOTE — Progress Notes (Signed)
[facility] Psychosocial Distress Screening Spiritual Care  Met with Colleen Montgomery and her daughter in [facility] to introduce [facility] team/resources, reviewing distress screen per protocol.  The patient scored a 6 on the Psychosocial Distress Thermometer which indicates moderate distress. Also assessed for distress and other psychosocial needs.    04/15/2022  ONCBCN DISTRESS SCREENING   Screening Type Initial Screening   Distress experienced in past week (1-10) 6   Practical problem type Insurance   Emotional problem type Adjusting to illness   Information Concerns Type Lack of info about treatment;Lack of info about complementary therapy choices   Referral to support programs Yes    [name] reports significant relief now that she has met team and learned more about her treatment plan, including some about how insurance may work for surgery. 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 One of her daily coping and meaning-making rituals is walking her dog.  Provided empathic listening, emotional support, normalization of feelings, and introduction to Colleen Montgomery support programming.  Follow up needed: Yes.  [name] welcomes a follow-up call from [place]. Colleen Pesci/Counseling Intern plans to reach out next week.   [facility], [place], [facility] Pager [phone] Voicemail [phone]

## 2022-04-15 NOTE — Research (Signed)
MTG-015 - Tissue and Bodily Fluids: Translational Medicine: Discovery and Evaluation of Biomarkers/Pharmacogenomics for the Diagnosis and Personalized Management of Patients    04/15/2022  CONSENT INTRO:  Patient JOBETH PANGILINAN was identified by Dr. [name] as a potential candidate for the above listed study.  This Clinical Research Coordinator met with EVALYN SHULTIS, [id number], on 04/15/22 in a manner and location that ensures patient privacy to discuss participation in the above listed research study.  Patient is Accompanied by her daughter .  A copy of the informed consent document and separate HIPAA Authorization was provided to the patient.  Patient reads, speaks, and understands [place].   Patient was provided with the business card of this Coordinator and encouraged to contact the research team with any questions.  Approximately 10 minutes were spent with the patient reviewing the informed consent documents.  Patient was provided the option of taking informed consent documents home to review and was encouraged to review at their convenience with their support network, including other care providers. Patient took the consent documents home to review.  Will follow-up with patient in a couple of days. Thanked patient for her time and consideration of the above mentioned study.   Carol Ada, RT(R)(T) Clinical Research Coordinator

## 2022-04-16 ENCOUNTER — Encounter: Payer: Self-pay | Admitting: Genetic Counselor

## 2022-04-16 DIAGNOSIS — Z800 Family history of malignant neoplasm of digestive organs: Secondary | ICD-10-CM | POA: Insufficient documentation

## 2022-04-16 DIAGNOSIS — Z8042 Family history of malignant neoplasm of prostate: Secondary | ICD-10-CM | POA: Insufficient documentation

## 2022-04-16 DIAGNOSIS — Z803 Family history of malignant neoplasm of breast: Secondary | ICD-10-CM | POA: Insufficient documentation

## 2022-04-17 ENCOUNTER — Encounter: Payer: Self-pay | Admitting: Plastic Surgery

## 2022-04-17 ENCOUNTER — Ambulatory Visit: Admitting: Plastic Surgery

## 2022-04-17 ENCOUNTER — Encounter: Payer: Self-pay | Admitting: *Deleted

## 2022-04-17 VITALS — BP 125/71 | HR 68 | Ht 66.0 in | Wt 167.0 lb

## 2022-04-17 DIAGNOSIS — C50411 Malignant neoplasm of upper-outer quadrant of right female breast: Secondary | ICD-10-CM

## 2022-04-17 NOTE — Progress Notes (Signed)
     Referring Provider Coralie Keens, MD [address],  [place] [address]   CC:  Right breast cancer, large breast and back pain  Colleen Montgomery is an 56 y.o. female.  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  She had a biopsy 2 weeks ago.  She is being seen by Dr. [name] who is planning surgery and referred her for the possibility of oncoplastic reduction.  She does not use any tobacco and does not have a history of diabetes.  She is on no blood thinners.  Her last mammogram was 04/01/2022.  She is not positive but she does anticipate radiation and possible chemotherapy if she has lumpectomy or oncoplastic reduction.  She has disease in a right axillary lymph node as well.    No Known Allergies  Outpatient Encounter Medications as of 04/17/2022  Medication Sig   Fluorouracil (TOLAK) 4 % CREA Apply 1 Application topically. Pea sized amount topical, once daily.  3 week rotate area   acetaminophen (TYLENOL) 325 MG tablet Take 650 mg by mouth every 6 (six) hours as needed for moderate pain.   levothyroxine (SYNTHROID, LEVOTHROID) 75 MCG tablet Take 75 mcg by mouth daily before breakfast.   [DISCONTINUED] dicyclomine (BENTYL) 20 MG tablet Take 1 tablet (20 mg total) by mouth 2 (two) times daily. (Patient not taking: Reported on 04/15/2022)   [DISCONTINUED] gabapentin (NEURONTIN) 100 MG capsule Take 2 capsules (200 mg total) at bedtime by mouth. (Patient not taking: Reported on 02/12/2018)   No facility-administered encounter medications on file as of 04/17/2022.     Past Medical History:  Diagnosis Date   Breast cancer ([place])    Diverticulosis    Thyroid disease     Past Surgical History:  Procedure Laterality Date   APPENDECTOMY      Family History  Problem Relation Age of Onset   Prostate cancer Father 17       metastatic   Colon cancer Maternal Uncle        dx. 60s/70s   Breast cancer Maternal  Grandmother     Social History   Social History Narrative   Not on file     Review of Systems General: Denies fevers, chills, weight loss CV: Denies chest pain, shortness of breath, palpitations   Physical Exam    04/17/2022   11:30 AM 04/15/2022   12:50 PM 05/27/2019    8:30 AM  Vitals with BMI  Height '5\' 6"'$  '5\' 6"'$    Weight 167 lbs 167 lbs   BMI 95.28 41.32   Systolic 440 102 725  Diastolic 71 82 74  Pulse 68 83 62    General:  No acute distress,  Alert and oriented, Non-Toxic, Normal speech and affect Breast: Palpable breast mass at 11:00 right breast lateral.  Assessment/Plan ***    [name] 04/17/2022, 1:55 PM

## 2022-04-21 ENCOUNTER — Telehealth: Payer: Self-pay | Admitting: *Deleted

## 2022-04-21 NOTE — Telephone Encounter (Signed)
Auth request for [id number] (Oncoplastic bil breast reduction) submitted via [facility] portal.

## 2022-04-22 ENCOUNTER — Other Ambulatory Visit: Payer: Self-pay | Admitting: Surgery

## 2022-04-22 ENCOUNTER — Telehealth: Payer: Self-pay | Admitting: Radiology

## 2022-04-22 DIAGNOSIS — C50911 Malignant neoplasm of unspecified site of right female breast: Secondary | ICD-10-CM

## 2022-04-22 NOTE — Telephone Encounter (Addendum)
MTG-015 - Tissue and Bodily Fluids: Translational Medicine: Discovery and Evaluation of Biomarkers/Pharmacogenomics for the Diagnosis and Personalized Management of Patients    04/22/2022  PHONE CALL: LVM for patient to return call to follow-up on the above mentioned study.   Carol Ada, RT(R)(T) Clinical Research Coordinator  04/24/2022  PHONE CALL: LVM for patient to return call to follow-up on the above mentioned study. On V/M let patient know she can return call if she is interested, but if she is not interested at this time, no need to return call.   Carol Ada, RT(R)(T) Clinical Research Coordinator  04/24/2022  PHONE CALL: Patient returned call. Patient is interested in participation for the above mentioned study. Scheduled consent visit for Tuesday 9/12 at 745AM with labs following. Thanked patient for her time and support of the above mentioned study.   Carol Ada, RT(R)(T) Clinical Research Coordinator

## 2022-04-23 ENCOUNTER — Encounter: Payer: Self-pay | Admitting: *Deleted

## 2022-04-23 ENCOUNTER — Telehealth: Payer: Self-pay

## 2022-04-23 ENCOUNTER — Telehealth: Payer: Self-pay | Admitting: *Deleted

## 2022-04-23 NOTE — Telephone Encounter (Signed)
Colleen Montgomery, counseling intern, called to follow up on how patient was doing post diagnosis.   Patient did not answer phone. Counselor will try calling again next week.   Lysle Morales  Counseling Intern

## 2022-04-23 NOTE — Telephone Encounter (Signed)
Left vm regarding [facility] from 04/15/22. Contact information provided for questions or needs.

## 2022-04-27 ENCOUNTER — Telehealth: Payer: Self-pay | Admitting: Genetic Counselor

## 2022-04-27 ENCOUNTER — Other Ambulatory Visit: Payer: Self-pay

## 2022-04-27 ENCOUNTER — Ambulatory Visit: Payer: Self-pay | Admitting: Genetic Counselor

## 2022-04-27 ENCOUNTER — Encounter: Payer: Self-pay | Admitting: Genetic Counselor

## 2022-04-27 DIAGNOSIS — Z1379 Encounter for other screening for genetic and chromosomal anomalies: Secondary | ICD-10-CM

## 2022-04-27 DIAGNOSIS — Z170 Estrogen receptor positive status [ER+]: Secondary | ICD-10-CM

## 2022-04-27 NOTE — Telephone Encounter (Signed)
I attempted to contact Ms. Seavey to discuss her genetic testing results (77 genes). I left a voicemail requesting she call me back at [phone].  Lucille Passy, MS, [facility] Genetic Counselor [email or web address] (P) [phone]

## 2022-04-27 NOTE — Telephone Encounter (Signed)
I contacted Ms. Betters to discuss her genetic testing results. No pathogenic variants were identified in the 77 genes analyzed. 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 Detailed clinic note to follow.  The test report has been scanned into EPIC and is located under the Molecular Pathology section of the Results Review tab.  A portion of the result report is included below for reference.   Lucille Passy, MS, [facility] Genetic Counselor [email or web address] (P) [phone]

## 2022-04-27 NOTE — Progress Notes (Signed)
HPI:   Colleen Montgomery was previously seen in the [facility] due to a personal and family history of cancer and concerns regarding a hereditary predisposition to cancer. Please refer to our prior cancer genetics clinic note for more information regarding our discussion, assessment and recommendations, at the time. Colleen Montgomery recent genetic test results were disclosed to her, as were recommendations warranted by these results. These results and recommendations are discussed in more detail below.  CANCER HISTORY:  Oncology History  Malignant neoplasm of upper-outer quadrant of right breast in female, estrogen receptor positive ([facility])  04/03/2022 Initial Diagnosis   Palpable right breast mass UOQ, by ultrasound at 11 o'clock position 1.9 cm mass which on biopsy was grade 3 IDC ER 60%, PR 0%, Ki-67 60%, HER2 negative, 1 lymph node in the axilla: Biopsy positive   04/15/2022 Cancer Staging   Staging form: Breast, AJCC 8th Edition - Clinical: Stage IIB (cT1c, cN1, cM0, G3, ER+, PR-, HER2-) - Signed by [name], MD on 04/15/2022 Stage prefix: Initial diagnosis Histologic grading system: 3 grade system    Genetic Testing   [facility] CancerNext-Expanded Panel was Negative. 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 Report date is 04/24/2022.  The CancerNext-Expanded gene panel offered by [facility] and includes sequencing, rearrangement, and RNA analysis for the following 77 genes: AIP, ALK, APC, ATM, AXIN2, BAP1, BARD1, BLM, BMPR1A, BRCA1, BRCA2, BRIP1, CDC73, CDH1, CDK4, CDKN1B, CDKN2A, CHEK2, CTNNA1, DICER1, FANCC, FH, FLCN, GALNT12, KIF1B, LZTR1, MAX, MEN1, MET, MLH1, MSH2, MSH3, MSH6, MUTYH, NBN, NF1, NF2, NTHL1, PALB2, PHOX2B, PMS2, POT1, PRKAR1A, PTCH1, PTEN, RAD51C, RAD51D, RB1, RECQL, RET, SDHA, SDHAF2, SDHB, SDHC, SDHD, SMAD4, SMARCA4, SMARCB1, SMARCE1, STK11, SUFU, TMEM127, TP53, TSC1, TSC2, VHL and XRCC2 (sequencing and  deletion/duplication); EGFR, EGLN1, HOXB13, KIT, MITF, PDGFRA, POLD1, and POLE (sequencing only); EPCAM and GREM1 (deletion/duplication only).      FAMILY HISTORY:  We obtained a detailed, 4-generation family history.  Significant diagnoses are listed below:      Family History  Problem Relation Age of Onset   Prostate cancer Father 98        metastatic   Colon cancer Maternal Uncle          dx. 40s/70s   Breast cancer Maternal Grandmother         Colleen Montgomery's maternal uncle was diagnosed with colon cancer in his 55s/70s. Her maternal aunt was diagnosed with lung cancer in her 41s. Her maternal grandmother was diagnosed with breast cancer at an unknown age, she is deceased. Colleen Montgomery father was diagnosed with prostate cancer at age 59, the prostate cancer later recurred and metastasized and he died due to metastatic prostate cancer at age 48.   Colleen Montgomery is unaware of previous family history of genetic testing for hereditary cancer risks. There is no reported Ashkenazi Jewish ancestry.   GENETIC TEST RESULTS:  The [facility] CancerNext-Expanded Panel found no pathogenic mutations.   The CancerNext-Expanded gene panel offered by [facility] and includes sequencing, rearrangement, and RNA analysis for the following 77 genes: AIP, ALK, APC, ATM, AXIN2, BAP1, BARD1, BLM, BMPR1A, BRCA1, BRCA2, BRIP1, CDC73, CDH1, CDK4, CDKN1B, CDKN2A, CHEK2, CTNNA1, DICER1, FANCC, FH, FLCN, GALNT12, KIF1B, LZTR1, MAX, MEN1, MET, MLH1, MSH2, MSH3, MSH6, MUTYH, NBN, NF1, NF2, NTHL1, PALB2, PHOX2B, PMS2, POT1, PRKAR1A, PTCH1, PTEN, RAD51C, RAD51D, RB1, RECQL, RET, SDHA, SDHAF2, SDHB, SDHC, SDHD, SMAD4, SMARCA4, SMARCB1, SMARCE1, STK11, SUFU, TMEM127, TP53, TSC1, TSC2, VHL and XRCC2 (sequencing  and deletion/duplication); EGFR, EGLN1, HOXB13, KIT, MITF, PDGFRA, POLD1, and POLE (sequencing only); EPCAM and GREM1 (deletion/duplication only).    The test report has been scanned into EPIC and is located under the  Molecular Pathology section of the Results Review tab.  A portion of the result report is included below for reference. Genetic testing reported out on 04/24/2022.          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  At this time, it is unknown if this variant is associated with an increased risk for cancer or if it is benign, but most uncertain variants are reclassified to benign. It should not be used to make medical management decisions. With time, we suspect the laboratory will determine the significance of this variant, if any. If the laboratory reclassifies this variant, we will attempt to contact Colleen Montgomery to discuss it further.   Even though a pathogenic variant was not identified, possible explanations for her personal history of cancer may include: There may be no hereditary risk for cancer in the family. The cancers in Ms. Booz and/or her family may be due to other genetic or environmental factors. There may be a gene mutation in one of these genes that current testing methods cannot detect, but that chance is small. There could be another gene that has not yet been discovered, or that we have not yet tested, that is responsible for the cancer diagnoses in the family.  The variant of uncertain significance detected in the PALB2 gene may be reclassified as a pathogenic variant in the future. At this time, we do not know if this variant increases the risk for cancer.  Therefore, it is important to remain in touch with cancer genetics in the future so that we can continue to offer Colleen Montgomery the most up to date genetic testing.   ADDITIONAL GENETIC TESTING:  We discussed with Ms. Scarantino that her genetic testing was fairly extensive.  If there are genes identified to increase cancer risk that can be analyzed in the future, we would be happy to discuss and coordinate this testing at that time.    CANCER SCREENING RECOMMENDATIONS:   Colleen Montgomery test result is considered negative (normal).  This means that we have not identified a hereditary cause for her personal and family history of cancer at this time.   An individual's cancer risk and medical management are not determined by genetic test results alone. Overall cancer risk assessment incorporates additional factors, including personal medical history, family history, and any available genetic information that may result in a personalized plan for cancer prevention and surveillance. Therefore, it is recommended she continue to follow the cancer management and screening guidelines provided by her oncology and primary healthcare provider.  RECOMMENDATIONS FOR FAMILY MEMBERS:   Since she did not inherit a mutation in a cancer predisposition gene included on this panel, her daughter could not have inherited a mutation from her in one of these genes. Individuals in this family might be at some increased risk of developing cancer, over the general population risk, due to the family history of cancer. We recommend women in this family have a yearly mammogram beginning at age 92, or 1 years younger than the earliest onset of cancer, an annual clinical breast exam, and perform monthly breast self-exams.  We do not recommend familial testing for the PALB2 variant of uncertain significance (VUS).  FOLLOW-UP:  Cancer genetics is a rapidly advancing field  and it is possible that new genetic tests will be appropriate for her and/or her family members in the future. We encouraged her to remain in contact with cancer genetics on an annual basis so we can update her personal and family histories and let her know of advances in cancer genetics that may benefit this family.   Our contact number was provided. Ms. Kozub questions were answered to her satisfaction, and she knows she is welcome to call us at anytime with additional questions or concerns.   Lucille Passy, MS, [facility] Genetic  Counselor [email or web address] (P) [phone]

## 2022-04-28 ENCOUNTER — Inpatient Hospital Stay

## 2022-04-28 ENCOUNTER — Other Ambulatory Visit: Payer: Self-pay

## 2022-04-28 ENCOUNTER — Inpatient Hospital Stay: Attending: Hematology and Oncology | Admitting: Radiology

## 2022-04-28 DIAGNOSIS — Z170 Estrogen receptor positive status [ER+]: Secondary | ICD-10-CM | POA: Insufficient documentation

## 2022-04-28 DIAGNOSIS — C50411 Malignant neoplasm of upper-outer quadrant of right female breast: Secondary | ICD-10-CM | POA: Insufficient documentation

## 2022-04-28 LAB — RESEARCH LABS

## 2022-04-28 NOTE — Research (Signed)
MTG-015 - Tissue and Bodily Fluids: Translational Medicine: Discovery and Evaluation of Biomarkers/Pharmacogenomics for the Diagnosis and Personalized Management of Patients   This Nurse has reviewed this patient's inclusion and exclusion criteria as a second review and confirms Colleen Montgomery is eligible for study participation.  Patient may continue with enrollment.  Marjie Skiff Karsten Howry, RN, BSN, [facility] She  Her  Hers Clinical Research Nurse [facility] Direct Dial [phone]  Pager [phone] 04/28/2022 8:55 AM

## 2022-04-28 NOTE — Research (Signed)
MTG-015 - Tissue and Bodily Fluids: Translational Medicine: Discovery and Evaluation of Biomarkers/Pharmacogenomics for the Diagnosis and Personalized Management of Patients    04/28/2022  ELIGIBILITY:  This Coordinator has reviewed this patient's inclusion and exclusion criteria and confirmed Colleen Montgomery is eligible for study participation.  Patient will continue with enrollment.  Menopausal status (women only): Colleen Montgomery is post-menopausal with LMP greater than 5 yrs ago.  Eligibility confirmed by research nurse and treating investigator, who also agrees that patient should proceed with enrollment.  CONSENT:  Patient Colleen Montgomery was identified by Dr. [name] as a potential candidate for the above listed study.  This Clinical Research Coordinator met with Colleen Montgomery, Colleen Montgomery, on 04/28/22 in a manner and location that ensures patient privacy to discuss participation in the above listed research study.  Patient is Unaccompanied.  A copy of the informed consent document and separate HIPAA Authorization was provided to the patient.  Patient reads, speaks, and understands [place].    Patient was provided with the business card of this Coordinator and encouraged to contact the research team with any questions.  Patient was provided the option of taking informed consent documents home to review and was encouraged to review at their convenience with their support network, including other care providers. Patient is comfortable with making a decision regarding study participation today. Patient was previously provided consent documentation.  As outlined in the informed consent form, this Coordinator and Colleen Montgomery discussed the purpose of the research study, the investigational nature of the study, study procedures and requirements for study participation, potential risks and benefits of study participation, as well as alternatives to participation. This study is not blinded.  The patient understands participation is voluntary and they may withdraw from study participation at any time.  This study does not involve randomization.  This study does not involve an investigational drug or device. This study does not involve a placebo. Patient understands enrollment is pending full eligibility review.   Confidentiality and how the patient's information will be used as part of study participation were discussed.  Patient was informed there is reimbursement provided for their time and effort spent on trial participation.  The patient is encouraged to discuss research study participation with their insurance provider to determine what costs they may incur as part of study participation, including research related injury.    All questions were answered to patient's satisfaction.  The informed consent with embedded HIPAA language was reviewed page by page.  The patient's mental and emotional status is appropriate to provide informed consent, and the patient verbalizes an understanding of study participation.  Patient has agreed to participate in the above listed research study and has voluntarily signed the informed consent dated 07/07/2021 version and separate HIPAA Authorization, version 5, revised 07/23/2019  on 04/28/22 at 805AM.  The patient was provided with a copy of the signed informed consent form and separate HIPAA Authorization for their reference.  No study specific procedures were obtained prior to the signing of the informed consent document.  Approximately 20 minutes were spent with the patient reviewing the informed consent documents.  Patient was not requested to complete a Release of Information form.   After consent was complete, the following medical history was obtained:  The following social, medical, and family history was collected from the patient on 04/28/22.  Date and time of last meal prior to blood collection: 04/27/2022 at 630PM  Does the patient drink  alcohol? Occasional  Does  the patient use tobacco products? No  Menopausal status? postmenopausal  Date of last menstrual cycle? Greater than 80yr  Does the patient have a personal history of cancer? No    Does the patient have family history of cancer in immediate family (grandparents, parents, and/or siblings)? Yes   If yes, what was the relationship, cancer type, date of diagnosis, and outcome for each?  Maternal grandmother: breast, UHelmut MusterFather: prostate; age 57 expiration  Has the patient received a COVID-19 vaccination? No    Has the patient ever tested positive for COVID? No    Has the patient received any other vaccines in the past year? No    The patient's medication list was reviewed with the patient and it was not correct and start dates were verified. Has the patient stopped any medications in the past 30 days? Yes   Has the patient had any environmental/occupational exposure to hazardous substances?  If yes, type and number of years  Purple sani wipes (current and former): 11 yrs  OR soaps for scrubbing (former): 2 yrs  Pesticides (former): UNK  LAB Blood Collection: Research blood obtained by Fresh venipuncture and urine was collected as well. Patient tolerated well without any adverse events. Gift Card: $50 gift card given to patient for her participation in both studies.   Patient was thanked for her time and support of the above mentioned study.   CCarol Ada RT(R)(T) Clinical Research Coordinator

## 2022-04-29 ENCOUNTER — Encounter: Payer: Self-pay | Admitting: *Deleted

## 2022-05-08 ENCOUNTER — Telehealth: Payer: Self-pay | Admitting: *Deleted

## 2022-05-08 ENCOUNTER — Encounter: Payer: Self-pay | Admitting: Hematology and Oncology

## 2022-05-08 NOTE — Telephone Encounter (Signed)
Per Dr.Gudena, called pt to confirm an appt 9/25 at 10:45 am. Pt verbalized understanding

## 2022-05-09 NOTE — Progress Notes (Signed)
Patient Care Team: [name], MD as PCP - General (Family Medicine) Mauro Kaufmann, RN as Oncology Nurse Navigator Rockwell Germany, RN as Oncology Nurse Navigator Coralie Keens, MD as Consulting Physician (General Surgery) [name], MD as Consulting Physician (Hematology and Oncology) Gery Pray, MD as Consulting Physician (Radiation Oncology)  DIAGNOSIS: No diagnosis found.  SUMMARY OF ONCOLOGIC HISTORY: Oncology History  Malignant neoplasm of upper-outer quadrant of right breast in female, estrogen receptor positive ([place])  04/03/2022 Initial Diagnosis   Palpable right breast mass UOQ, by ultrasound at 11 o'clock position 1.9 cm mass which on biopsy was grade 3 IDC ER 60%, PR 0%, Ki-67 60%, HER2 negative, 1 lymph node in the axilla: Biopsy positive   04/15/2022 Cancer Staging   Staging form: Breast, AJCC 8th Edition - Clinical: Stage IIB (cT1c, cN1, cM0, G3, ER+, PR-, HER2-) - Signed by [name], MD on 04/15/2022 Stage prefix: Initial diagnosis Histologic grading system: 3 grade system    Genetic Testing   [facility] CancerNext-Expanded Panel was Negative. 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 Report date is 04/24/2022.  The CancerNext-Expanded gene panel offered by [facility] and includes sequencing, rearrangement, and RNA analysis for the following 77 genes: AIP, ALK, APC, ATM, AXIN2, BAP1, BARD1, BLM, BMPR1A, BRCA1, BRCA2, BRIP1, CDC73, CDH1, CDK4, CDKN1B, CDKN2A, CHEK2, CTNNA1, DICER1, FANCC, FH, FLCN, GALNT12, KIF1B, LZTR1, MAX, MEN1, MET, MLH1, MSH2, MSH3, MSH6, MUTYH, NBN, NF1, NF2, NTHL1, PALB2, PHOX2B, PMS2, POT1, PRKAR1A, PTCH1, PTEN, RAD51C, RAD51D, RB1, RECQL, RET, SDHA, SDHAF2, SDHB, SDHC, SDHD, SMAD4, SMARCA4, SMARCB1, SMARCE1, STK11, SUFU, TMEM127, TP53, TSC1, TSC2, VHL and XRCC2 (sequencing and deletion/duplication); EGFR, EGLN1, HOXB13, KIT, MITF, PDGFRA, POLD1, and POLE (sequencing only); EPCAM and  GREM1 (deletion/duplication only).      CHIEF COMPLIANT: Follow-up after surgery   INTERVAL HISTORY: Colleen Montgomery is a 56 y.o. female is here because of recent diagnosis of right breast cancer. She presents to the clinic today for a follow-up after surgery.    ALLERGIES:  has No Known Allergies.  MEDICATIONS:  Current Outpatient Medications  Medication Sig Dispense Refill   acetaminophen (TYLENOL) 325 MG tablet Take 650 mg by mouth every 6 (six) hours as needed for moderate pain.     Fluorouracil (TOLAK) 4 % CREA Apply 1 Application topically. Pea sized amount topical, once daily.  3 week rotate area     levothyroxine (SYNTHROID, LEVOTHROID) 75 MCG tablet Take 75 mcg by mouth daily before breakfast.     No current facility-administered medications for this visit.    PHYSICAL EXAMINATION: ECOG PERFORMANCE STATUS: {CHL ONC ECOG [phone]}  There were no vitals filed for this visit. There were no vitals filed for this visit.  BREAST:*** No palpable masses or nodules in either right or left breasts. No palpable axillary supraclavicular or infraclavicular adenopathy no breast tenderness or nipple discharge. (exam performed in the presence of a chaperone)  LABORATORY DATA:  I have reviewed the data as listed    Latest Ref Rng & Units 04/15/2022   12:33 PM 05/27/2019    5:06 AM  CMP  Glucose 70 - 99 mg/dL 83  102   BUN 6 - 20 mg/dL 12  21   Creatinine 0.44 - 1.00 mg/dL 0.92  1.12   Sodium 135 - 145 mmol/L 140  140   Potassium 3.5 - 5.1 mmol/L 3.9  3.6   Chloride 98 - 111 mmol/L 106  106   CO2 22 -  32 mmol/L 28  25   Calcium 8.9 - 10.3 mg/dL 9.9  9.1   Total Protein 6.5 - 8.1 g/dL 7.1  7.4   Total Bilirubin 0.3 - 1.2 mg/dL 0.5  0.6   Alkaline Phos 38 - 126 U/L 61  69   AST 15 - 41 U/L 18  19   ALT 0 - 44 U/L 16  19     Lab Results  Component Value Date   WBC 4.5 04/15/2022   HGB 12.6 04/15/2022   HCT 37.2 04/15/2022   MCV 85.3 04/15/2022   PLT 235 04/15/2022    NEUTROABS 2.9 04/15/2022    ASSESSMENT & PLAN:  No problem-specific Assessment & Plan notes found for this encounter.    No orders of the defined types were placed in this encounter.  The patient has a good understanding of the overall plan. she agrees with it. she will call with any problems that may develop before the next visit here. Total time spent: 30 mins including face to face time and time spent for planning, charting and co-ordination of care   Colleen Montgomery, Colleen Montgomery 05/09/22    I Gardiner Coins am scribing for Dr. [name]  ***

## 2022-05-11 ENCOUNTER — Encounter: Payer: Self-pay | Admitting: *Deleted

## 2022-05-11 ENCOUNTER — Telehealth: Payer: Self-pay | Admitting: Hematology and Oncology

## 2022-05-11 ENCOUNTER — Inpatient Hospital Stay: Admitting: Hematology and Oncology

## 2022-05-11 ENCOUNTER — Telehealth: Payer: Self-pay | Admitting: *Deleted

## 2022-05-11 ENCOUNTER — Other Ambulatory Visit: Payer: Self-pay

## 2022-05-11 ENCOUNTER — Other Ambulatory Visit: Payer: Self-pay | Admitting: Surgery

## 2022-05-11 ENCOUNTER — Telehealth: Payer: Self-pay

## 2022-05-11 VITALS — BP 126/78 | HR 67 | Temp 97.9°F | Resp 18 | Ht 66.0 in | Wt 169.0 lb

## 2022-05-11 DIAGNOSIS — C50411 Malignant neoplasm of upper-outer quadrant of right female breast: Secondary | ICD-10-CM

## 2022-05-11 DIAGNOSIS — Z170 Estrogen receptor positive status [ER+]: Secondary | ICD-10-CM | POA: Diagnosis not present

## 2022-05-11 MED ORDER — DEXAMETHASONE 4 MG PO TABS: ORAL_TABLET | ORAL | 0 refills | Status: DC

## 2022-05-11 MED ORDER — LIDOCAINE-PRILOCAINE 2.5-2.5 % EX CREA: TOPICAL_CREAM | CUTANEOUS | 3 refills | Status: DC

## 2022-05-11 MED ORDER — ONDANSETRON HCL 8 MG PO TABS: 8.0000 mg | ORAL_TABLET | Freq: Three times a day (TID) | ORAL | 1 refills | Status: DC | PRN

## 2022-05-11 MED ORDER — PROCHLORPERAZINE MALEATE 10 MG PO TABS: 10.0000 mg | ORAL_TABLET | Freq: Four times a day (QID) | ORAL | 1 refills | Status: DC | PRN

## 2022-05-11 NOTE — Telephone Encounter (Signed)
Received Oncotype score of 51. Physician and pt aware of results.

## 2022-05-11 NOTE — Progress Notes (Signed)
START ON PATHWAY REGIMEN - Breast     Cycles 1 through 4: A cycle is every 21 days:     Pembrolizumab      Paclitaxel      Carboplatin      Filgrastim-xxxx    Cycles 5 through 8: A cycle is every 21 days:     Pembrolizumab      Doxorubicin      Cyclophosphamide      Pegfilgrastim-xxxx   **Always confirm dose/schedule in your pharmacy ordering system**  Patient Characteristics: Preoperative or Nonsurgical Candidate (Clinical Staging), Neoadjuvant Therapy followed by Surgery, Invasive Disease, Chemotherapy, HER2 Negative/Unknown/Equivocal, ER Negative/Unknown, Platinum Therapy Indicated and Candidate for Checkpoint Inhibitor Therapeutic Status: Preoperative or Nonsurgical Candidate (Clinical Staging) AJCC M Category: cM0 AJCC Grade: G3 Breast Surgical Plan: Neoadjuvant Therapy followed by Surgery ER Status: Negative (-) AJCC 8 Stage Grouping: IIB HER2 Status: Negative (-) AJCC T Category: cT1c AJCC N Category: cN1 PR Status: Negative (-) Intent of Therapy: Curative Intent, Discussed with Patient

## 2022-05-11 NOTE — Assessment & Plan Note (Addendum)
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 Given the positive lymph node and the high risk Oncotype, I recommended doing neoadjuvant chemotherapy.  Treatment plan: 1.  Neoadjuvant chemotherapy with Taxol Doristine Church followed by Adriamycin Cytoxan Keytruda followed by Beryle Flock maintenance for 1 year. 2. breast conserving surgery with targeted node dissection 3.  Adjuvant radiation We can see the final estrogen receptor status on the path and decide on antiestrogen therapy  Plan: Port placement, echo, chemo class and start chemotherapy in 1 to 2 weeks. Counseled her about neuropathy clinical trial as well as nausea clinical trial.

## 2022-05-11 NOTE — Telephone Encounter (Signed)
Scheduled appointment per WQ. Left voicemail. 

## 2022-05-11 NOTE — Telephone Encounter (Signed)
Pt called and states she saw MD today and after leaving the office, she realized she had so many more questions regarding treatment. One question being why she will have chemotherapy before surgery. Attempted to educate pt and she interrupted me, stating she prefers to have this conversation with Dr [name] as she has other questions she needs answered. Pt was offered telephone appt for 9/27 at 1130 and she declined, asking for it to be a face-to-face visit. Pt has been scheduled for 05/13/22 at 1130.

## 2022-05-12 ENCOUNTER — Other Ambulatory Visit: Payer: Self-pay | Admitting: Surgery

## 2022-05-12 ENCOUNTER — Other Ambulatory Visit: Payer: Self-pay

## 2022-05-12 ENCOUNTER — Ambulatory Visit: Admitting: Physical Therapy

## 2022-05-12 ENCOUNTER — Other Ambulatory Visit: Payer: Self-pay | Admitting: Hematology and Oncology

## 2022-05-12 DIAGNOSIS — C50411 Malignant neoplasm of upper-outer quadrant of right female breast: Secondary | ICD-10-CM

## 2022-05-13 ENCOUNTER — Inpatient Hospital Stay: Admitting: Hematology and Oncology

## 2022-05-13 ENCOUNTER — Other Ambulatory Visit: Payer: Self-pay

## 2022-05-13 ENCOUNTER — Other Ambulatory Visit: Payer: Self-pay | Admitting: *Deleted

## 2022-05-13 DIAGNOSIS — Z170 Estrogen receptor positive status [ER+]: Secondary | ICD-10-CM

## 2022-05-13 DIAGNOSIS — C50411 Malignant neoplasm of upper-outer quadrant of right female breast: Secondary | ICD-10-CM

## 2022-05-13 NOTE — Progress Notes (Signed)
Patient Care Team: [name], MD as PCP - General (Family Medicine) Mauro Kaufmann, RN as Oncology Nurse Navigator Rockwell Germany, RN as Oncology Nurse Navigator Coralie Keens, MD as Consulting Physician (General Surgery) [name], MD as Consulting Physician (Hematology and Oncology) Gery Pray, MD as Consulting Physician (Radiation Oncology)  DIAGNOSIS:  Encounter Diagnosis  Name Primary?   Malignant neoplasm of upper-outer quadrant of right breast in female, estrogen receptor positive ([place])     SUMMARY OF ONCOLOGIC HISTORY: Oncology History  Malignant neoplasm of upper-outer quadrant of right breast in female, estrogen receptor positive ([place])  04/03/2022 Initial Diagnosis   Palpable right breast mass UOQ, by ultrasound at 11 o'clock position 1.9 cm mass which on biopsy was grade 3 IDC ER 60%, PR 0%, Ki-67 60%, HER2 negative, 1 lymph node in the axilla: Biopsy positive   04/15/2022 Cancer Staging   Staging form: Breast, AJCC 8th Edition - Clinical: Stage IIB (cT1c, cN1, cM0, G3, ER+, PR-, HER2-) - Signed by [name], MD on 04/15/2022 Stage prefix: Initial diagnosis Histologic grading system: 3 grade system    Genetic Testing   [facility] CancerNext-Expanded Panel was Negative. 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 Report date is 04/24/2022.  The CancerNext-Expanded gene panel offered by [facility] and includes sequencing, rearrangement, and RNA analysis for the following 77 genes: AIP, ALK, APC, ATM, AXIN2, BAP1, BARD1, BLM, BMPR1A, BRCA1, BRCA2, BRIP1, CDC73, CDH1, CDK4, CDKN1B, CDKN2A, CHEK2, CTNNA1, DICER1, FANCC, FH, FLCN, GALNT12, KIF1B, LZTR1, MAX, MEN1, MET, MLH1, MSH2, MSH3, MSH6, MUTYH, NBN, NF1, NF2, NTHL1, PALB2, PHOX2B, PMS2, POT1, PRKAR1A, PTCH1, PTEN, RAD51C, RAD51D, RB1, RECQL, RET, SDHA, SDHAF2, SDHB, SDHC, SDHD, SMAD4, SMARCA4, SMARCB1, SMARCE1, STK11, SUFU, TMEM127, TP53, TSC1, TSC2, VHL and  XRCC2 (sequencing and deletion/duplication); EGFR, EGLN1, HOXB13, KIT, MITF, PDGFRA, POLD1, and POLE (sequencing only); EPCAM and GREM1 (deletion/duplication only).    05/26/2022 -  Chemotherapy   Patient is on Treatment Plan : BREAST Pembrolizumab (200) D1 + Carboplatin (1.5) D1,8,15 + Paclitaxel (80) D1,8,15 q21d X 4 cycles / Pembrolizumab (200) D1 + AC D1 q21d x 4 cycles       CHIEF COMPLIANT: Follow-up to discuss treatment  INTERVAL HISTORY: Colleen Montgomery is a 56 y.o. with above-mentioned. She presents to the clinic for a follow-up to discuss treatment. She states that she doesn't want to have chemo. She is just trying to process everything.     ALLERGIES:  has No Known Allergies.  MEDICATIONS:  Current Outpatient Medications  Medication Sig Dispense Refill   dexamethasone (DECADRON) 4 MG tablet Take 1 tablet day before Adriamycin and Cytoxan and 1 tablet day after chemo with food 8 tablet 0   levothyroxine (SYNTHROID, LEVOTHROID) 75 MCG tablet Take 75 mcg by mouth daily before breakfast.     lidocaine-prilocaine (EMLA) cream Apply to affected area once 30 g 3   ondansetron (ZOFRAN) 8 MG tablet Take 1 tablet (8 mg total) by mouth every 8 (eight) hours as needed for nausea or vomiting. Start on the third day after chemotherapy. 30 tablet 1   prochlorperazine (COMPAZINE) 10 MG tablet Take 1 tablet (10 mg total) by mouth every 6 (six) hours as needed for nausea or vomiting. 30 tablet 1   No current facility-administered medications for this visit.    PHYSICAL EXAMINATION: ECOG PERFORMANCE STATUS: 1 - Symptomatic but completely ambulatory  Vitals:   05/13/22 1129  BP: 128/76  Pulse: 81  Resp: 18  Temp: (!) 97.3 F (36.3 C)  SpO2: 100%   Filed Weights   05/13/22 1129  Weight: 168 lb (76.2 kg)     LABORATORY DATA:  I have reviewed the data as listed    Latest Ref Rng & Units 04/15/2022   12:33 PM 05/27/2019    5:06 AM  CMP  Glucose 70 - 99 mg/dL 83  102   BUN 6 -  20 mg/dL 12  21   Creatinine 0.44 - 1.00 mg/dL 0.92  1.12   Sodium 135 - 145 mmol/L 140  140   Potassium 3.5 - 5.1 mmol/L 3.9  3.6   Chloride 98 - 111 mmol/L 106  106   CO2 22 - 32 mmol/L 28  25   Calcium 8.9 - 10.3 mg/dL 9.9  9.1   Total Protein 6.5 - 8.1 g/dL 7.1  7.4   Total Bilirubin 0.3 - 1.2 mg/dL 0.5  0.6   Alkaline Phos 38 - 126 U/L 61  69   AST 15 - 41 U/L 18  19   ALT 0 - 44 U/L 16  19     Lab Results  Component Value Date   WBC 4.5 04/15/2022   HGB 12.6 04/15/2022   HCT 37.2 04/15/2022   MCV 85.3 04/15/2022   PLT 235 04/15/2022   NEUTROABS 2.9 04/15/2022    ASSESSMENT & PLAN:  Malignant neoplasm of upper-outer quadrant of right breast in female, estrogen receptor positive ([place]) 04/03/2022:Palpable right breast mass UOQ, by ultrasound at 11 o'clock position 1.9 cm mass which on biopsy was grade 3 IDC ER 60%, PR 0%, Ki-67 60%, HER2 negative, 1 lymph node in the axilla: Biopsy positive.   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 Given the positive lymph node and the high risk Oncotype, I recommended doing neoadjuvant chemotherapy.   Treatment plan: 1.  Neoadjuvant chemotherapy with Taxol Doristine Church followed by Adriamycin Cytoxan Keytruda followed by Beryle Flock maintenance for 1 year. 2. breast conserving surgery with targeted node dissection 3.  Adjuvant radiation 4. +/- Antiestrogens (based upon final pathology) -------------------------------------------------------------------------------------------------------- After discussion last week patient had a chance to think about it and came back accompanied by her daughter to say that she does not want to go through chemotherapy and that she will consider some holistic treatment options.  I discussed with her that she needs to get a second opinion and therefore we will refer her to [facility]. If after  listening to multiple opinions she decides that she does not want to do chemotherapy then we will proceed with breast conserving surgery.  Patient is extremely young and healthy and therefore we felt that it was necessary to be aggressive and prevent distant recurrences.   No orders of the defined types were placed in this encounter.  The patient has a good understanding of the overall plan. she agrees with it. she will call with any problems that may develop before the next visit here. Total time spent: 30 mins including face to face time and time spent for planning, charting and co-ordination of care   Harriette Ohara, MD 05/13/22    I Gardiner Coins am scribing for Dr. [name]  I have reviewed the above documentation for accuracy and completeness, and I agree with the above.

## 2022-05-13 NOTE — Progress Notes (Signed)
The pharmacy team has substituted IV diphenhydramine for IV cetirizine as a premedication. Patient will be monitored for hypersensitivity reaction and adverse reactions to IV cetirizine. Thanks.   Kennith Center, Pharm.D., CPP 05/13/2022'@5'$ :01 PM

## 2022-05-13 NOTE — Progress Notes (Signed)
Per MD request RN successfully faxed second opinion referral to first available provider with [facility] [phone]).

## 2022-05-13 NOTE — Assessment & Plan Note (Signed)
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 Given the positive lymph node and the high risk Oncotype, I recommended doing neoadjuvant chemotherapy.  Treatment plan: 1.  Neoadjuvant chemotherapy with Taxol Doristine Church followed by Adriamycin Cytoxan Keytruda followed by Beryle Flock maintenance for 1 year. 2. breast conserving surgery with targeted node dissection 3.  Adjuvant radiation 4. +/- Antiestrogens (based upon final pathology)  Additional counseling: Patient had several other questions regarding chemotherapy and today's visit is mainly to help answer those questions.

## 2022-05-15 ENCOUNTER — Encounter (HOSPITAL_COMMUNITY): Payer: Self-pay | Admitting: Anesthesiology

## 2022-05-15 ENCOUNTER — Inpatient Hospital Stay (HOSPITAL_COMMUNITY): Admission: RE | Admit: 2022-05-15 | Source: Ambulatory Visit

## 2022-05-15 ENCOUNTER — Telehealth: Payer: Self-pay | Admitting: *Deleted

## 2022-05-15 ENCOUNTER — Inpatient Hospital Stay

## 2022-05-15 NOTE — Anesthesia Preprocedure Evaluation (Deleted)
Anesthesia Evaluation    Reviewed: Allergy & Precautions, Patient's Chart, lab work & pertinent test results  Airway        Dental   Pulmonary neg pulmonary ROS,           Cardiovascular negative cardio ROS       Neuro/Psych negative neurological ROS  negative psych ROS   GI/Hepatic negative GI ROS, Neg liver ROS,   Endo/Other  Hypothyroidism   Renal/GU negative Renal ROS  negative genitourinary   Musculoskeletal negative musculoskeletal ROS (+)   Abdominal   Peds  Hematology negative hematology ROS (+)   Anesthesia Other Findings R breast ca  Reproductive/Obstetrics negative OB ROS                             Anesthesia Physical Anesthesia Plan  ASA: 2  Anesthesia Plan: General   Post-op Pain Management: Tylenol PO (pre-op)* and Toradol IV (intra-op)*   Induction: Intravenous  PONV Risk Score and Plan: 3 and Ondansetron, Dexamethasone, Midazolam and Treatment may vary due to age or medical condition  Airway Management Planned: LMA  Additional Equipment: None  Intra-op Plan:   Post-operative Plan: Extubation in OR  Informed Consent:   Plan Discussed with:   Anesthesia Plan Comments:         Anesthesia Quick Evaluation

## 2022-05-15 NOTE — Telephone Encounter (Signed)
Pt scheduled for 4 pm education appt.  Pt has not shown for appt.  Called & left message for pt to return call.  Message to Dr Rolla Plate RN

## 2022-05-19 ENCOUNTER — Telehealth: Payer: Self-pay | Admitting: *Deleted

## 2022-05-19 ENCOUNTER — Ambulatory Visit (HOSPITAL_COMMUNITY): Attending: Hematology and Oncology

## 2022-05-19 ENCOUNTER — Other Ambulatory Visit: Payer: Self-pay | Admitting: Surgery

## 2022-05-19 DIAGNOSIS — Z853 Personal history of malignant neoplasm of breast: Secondary | ICD-10-CM

## 2022-05-19 NOTE — Progress Notes (Signed)
Pharmacist Chemotherapy Monitoring - Initial Assessment    Anticipated start date: 05/26/22    The following has been reviewed per standard work regarding the patient's treatment regimen: The patient's diagnosis, treatment plan and drug doses, and organ/hematologic function Lab orders and baseline tests specific to treatment regimen  The treatment plan start date, drug sequencing, and pre-medications Prior authorization status  Patient's documented medication list, including drug-drug interaction screen and prescriptions for anti-emetics and supportive care specific to the treatment regimen The drug concentrations, fluid compatibility, administration routes, and timing of the medications to be used The patient's access for treatment and lifetime cumulative dose history, if applicable  The patient's medication allergies and previous infusion related reactions, if applicable   Changes made to treatment plan:  N/A  Follow up needed:  Pending authorization for treatment    Larene Beach, [place], 05/19/2022  9:31 AM

## 2022-05-19 NOTE — Telephone Encounter (Signed)
Received  a msg from pt that she would like to proceed with sx. Called pt, left vm informing msg was received. Msg sent to physician team and sx scheduler to proceed with sx scheduling.

## 2022-05-19 NOTE — H&P (View-Only) (Signed)
Pharmacist Chemotherapy Monitoring - Initial Assessment    Anticipated start date: 05/26/22    The following has been reviewed per standard work regarding the patient's treatment regimen: The patient's diagnosis, treatment plan and drug doses, and organ/hematologic function Lab orders and baseline tests specific to treatment regimen  The treatment plan start date, drug sequencing, and pre-medications Prior authorization status  Patient's documented medication list, including drug-drug interaction screen and prescriptions for anti-emetics and supportive care specific to the treatment regimen The drug concentrations, fluid compatibility, administration routes, and timing of the medications to be used The patient's access for treatment and lifetime cumulative dose history, if applicable  The patient's medication allergies and previous infusion related reactions, if applicable   Changes made to treatment plan:  N/A  Follow up needed:  Pending authorization for treatment    Larene Beach, [place], 05/19/2022  9:31 AM

## 2022-05-22 ENCOUNTER — Other Ambulatory Visit

## 2022-05-25 ENCOUNTER — Ambulatory Visit (HOSPITAL_BASED_OUTPATIENT_CLINIC_OR_DEPARTMENT_OTHER): Admit: 2022-05-25 | Admitting: Surgery

## 2022-05-25 ENCOUNTER — Encounter

## 2022-05-25 ENCOUNTER — Encounter (HOSPITAL_BASED_OUTPATIENT_CLINIC_OR_DEPARTMENT_OTHER): Payer: Self-pay

## 2022-05-25 SURGERY — INSERTION, TUNNELED CENTRAL VENOUS DEVICE, WITH PORT
Anesthesia: General

## 2022-05-25 MED FILL — Dexamethasone Sodium Phosphate Inj 100 MG/10ML: INTRAMUSCULAR | Qty: 1 | Status: AC

## 2022-05-26 ENCOUNTER — Inpatient Hospital Stay: Admitting: Hematology and Oncology

## 2022-05-26 ENCOUNTER — Other Ambulatory Visit

## 2022-05-26 ENCOUNTER — Inpatient Hospital Stay

## 2022-05-26 ENCOUNTER — Encounter: Payer: Self-pay | Admitting: *Deleted

## 2022-05-29 ENCOUNTER — Ambulatory Visit

## 2022-05-29 ENCOUNTER — Ambulatory Visit: Admitting: Hematology and Oncology

## 2022-05-29 ENCOUNTER — Other Ambulatory Visit

## 2022-06-01 MED FILL — Dexamethasone Sodium Phosphate Inj 100 MG/10ML: INTRAMUSCULAR | Qty: 1 | Status: AC

## 2022-06-02 ENCOUNTER — Inpatient Hospital Stay

## 2022-06-02 ENCOUNTER — Telehealth: Payer: Self-pay | Admitting: *Deleted

## 2022-06-02 ENCOUNTER — Inpatient Hospital Stay: Attending: Hematology and Oncology | Admitting: Hematology and Oncology

## 2022-06-02 ENCOUNTER — Encounter: Payer: Self-pay | Admitting: *Deleted

## 2022-06-02 NOTE — Telephone Encounter (Signed)
Patient was told by [facility] @ [facility] to be expecting a call from me. Attempted to contact patient x2. No answer / vm full. Went ahead and scheduled pre-op because surgery is scheduled for Monday 06/08/22

## 2022-06-02 NOTE — Telephone Encounter (Signed)
Pt missed appt to see Dr. [name] on 10/17 at 11:15am. Called pt to see if she  has made a decision regarding treatment. No answer, unable to leave vm as mailbox full. Dr. [name] notified.

## 2022-06-02 NOTE — Assessment & Plan Note (Deleted)
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 Given the positive lymph node and the high risk Oncotype, I recommended doing neoadjuvant chemotherapy.  Treatment plan: 1.Neoadjuvant chemotherapy with Taxol Doristine Church followed by Adriamycin Cytoxan Keytruda followed by Beryle Flock maintenance for 1 year. 2.breast conserving surgery with targeted node dissection 3.Adjuvant radiation 4. +/- Antiestrogens (based upon final pathology) -------------------------------------------------------------------------------------------------------- Patient had initially decided of not going through chemotherapy and would consider holistic treatment options.  We referred her to [facility] for second opinion.

## 2022-06-02 NOTE — Telephone Encounter (Signed)
Pt returned secure email notifying she has decided to pursue surgery only. Discussed post op with Dr. [name] to discuss final pathology and surveillance. Physician team notified.

## 2022-06-03 ENCOUNTER — Encounter: Admitting: Student

## 2022-06-03 ENCOUNTER — Other Ambulatory Visit: Payer: Self-pay

## 2022-06-04 ENCOUNTER — Encounter: Payer: Self-pay | Admitting: *Deleted

## 2022-06-04 ENCOUNTER — Ambulatory Visit (INDEPENDENT_AMBULATORY_CARE_PROVIDER_SITE_OTHER): Admitting: Student

## 2022-06-04 ENCOUNTER — Ambulatory Visit: Admitting: Hematology and Oncology

## 2022-06-04 VITALS — BP 121/78 | HR 80 | Ht 66.0 in | Wt 167.8 lb

## 2022-06-04 DIAGNOSIS — C50411 Malignant neoplasm of upper-outer quadrant of right female breast: Secondary | ICD-10-CM

## 2022-06-04 MED ORDER — OXYCODONE HCL 5 MG PO TABS: 5.0000 mg | ORAL_TABLET | Freq: Three times a day (TID) | ORAL | 0 refills | Status: DC | PRN

## 2022-06-04 MED ORDER — ONDANSETRON HCL 4 MG PO TABS: 4.0000 mg | ORAL_TABLET | Freq: Three times a day (TID) | ORAL | 0 refills | Status: DC | PRN

## 2022-06-04 NOTE — Progress Notes (Signed)
Patient ID: Colleen Montgomery, female    DOB: 06-17-1966, 56 y.o.   MRN: [id number]  Chief Complaint  Patient presents with   Pre-op Exam      ICD-10-CM   1. Malignant neoplasm of upper-outer quadrant of right female breast, unspecified estrogen receptor status ([place])  C50.411        History of Present Illness: Colleen Montgomery is a 56 y.o.  female  with a history of right breast cancer.  She presents for preoperative evaluation for upcoming procedure, right breast lumpectomy and right axillary sentinel lymph node dissection with Dr. [name] and bilateral oncoplastic breast reduction with Dr. Erin Hearing, scheduled for 06/08/2022.  The patient has not had problems with anesthesia.  Patient denies any history of cardiac disease.  She states that she does not take any blood thinners.  She reports that she is not a smoker.  Patient denies taking any birth control or hormone replacement.  She does report history of 1 miscarriage.  She denies any personal or family history of blood clots or clotting diseases.  Patient denies any recent surgeries, traumas, infections, pregnancies, strokes or heart attacks.  She denies any history of inflammatory bowel disease or lung disease.  Patient has positive history for cancer.  She denies any varicose veins or swollen legs.  She denies any recent fevers, chills, shortness of breath.  She denies any changes in her health recently.  Patient reports that she is currently a 36 G cup and she would like to be a small D cup.  I discussed with the patient that there are limitations included with surgery in terms of the cup size that she will have after 3.  Patient expressed understanding.  Summary of Previous Visit: Patient was seen for consult by Dr. Erin Hearing on 04/17/2022.  At this visit, she reported that she had newly diagnosed right breast cancer.  She reported that she was being seen by Dr. [name] who is planning surgery and was referred to plastic surgery for  consult for oncoplastic reduction.  Patient reported that she does anticipate radiation and possible chemotherapy after surgery.  Patient was found to be a good candidate for oncoplastic bilateral breast reduction.  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  She states that she is planning to take a few weeks off of work and has already let her work know.  I discussed with the patient that she will not be able to lift/pull/carry anything greater than 10 pounds for 6 weeks.  Patient expressed understanding.  I discussed with the patient that if she needs any FMLA paperwork filled out to let us know.  PMH Significant for: Breast cancer  Patient reports the only medications she is currently taking are her eyedrops and her vitamins.  She denies taking any steroids.   Past Medical History: Allergies: No Known Allergies  Current Medications:  Current Outpatient Medications:    carboxymethylcellulose (REFRESH TEARS) 0.5 % SOLN, Place 1 drop into both eyes daily., Disp: , Rfl:    dexamethasone (DECADRON) 4 MG tablet, Take 1 tablet day before Adriamycin and Cytoxan and 1 tablet day after chemo with food (Patient not taking: Reported on 06/02/2022), Disp: 8 tablet, Rfl: 0   lidocaine-prilocaine (EMLA) cream, Apply to affected area once (Patient not taking: Reported on 06/02/2022), Disp: 30 g, Rfl: 3   NONFORMULARY OR COMPOUNDED ITEM, Take 1 capsule by mouth daily. [facility]  Lioehyronine sodium (T3 ) 12 mcg Thyroxine/L (  T4) 40 mcg, Disp: , Rfl:    ondansetron (ZOFRAN) 4 MG tablet, Take 1 tablet (4 mg total) by mouth every 8 (eight) hours as needed for up to 20 doses for nausea or vomiting., Disp: 20 tablet, Rfl: 0   ondansetron (ZOFRAN) 8 MG tablet, Take 1 tablet (8 mg total) by mouth every 8 (eight) hours as needed for nausea or vomiting. Start on the third day after chemotherapy. (Patient not taking: Reported on 06/02/2022), Disp: 30 tablet, Rfl: 1   oxyCODONE  (ROXICODONE) 5 MG immediate release tablet, Take 1 tablet (5 mg total) by mouth every 8 (eight) hours as needed for up to 20 doses for severe pain., Disp: 20 tablet, Rfl: 0   prochlorperazine (COMPAZINE) 10 MG tablet, Take 1 tablet (10 mg total) by mouth every 6 (six) hours as needed for nausea or vomiting. (Patient not taking: Reported on 06/02/2022), Disp: 30 tablet, Rfl: 1   Vitamin D-Vitamin K (K2 PLUS D3 PO), Take 1 tablet by mouth daily., Disp: , Rfl:   Past Medical Problems: Past Medical History:  Diagnosis Date   Breast cancer ([place])    Diverticulosis    Thyroid disease     Past Surgical History: Past Surgical History:  Procedure Laterality Date   APPENDECTOMY      Social History: Social History   Socioeconomic History   Marital status: Married    Spouse name: Not on file   Number of children: Not on file   Years of education: Not on file   Highest education level: Not on file  Occupational History   Not on file  Tobacco Use   Smoking status: Never   Smokeless tobacco: Never  Substance and Sexual Activity   Alcohol use: Yes   Drug use: Not Currently   Sexual activity: Not on file  Other Topics Concern   Not on file  Social History Narrative   Not on file   Social Determinants of Health   Financial Resource Strain: Not on file  Food Insecurity: Not on file  Transportation Needs: Not on file  Physical Activity: Not on file  Stress: Not on file  Social Connections: Not on file  Intimate Partner Violence: Not on file    Family History: Family History  Problem Relation Age of Onset   Prostate cancer Father 72       metastatic   Colon cancer Maternal Uncle        dx. 60s/70s   Breast cancer Maternal Grandmother     Review of Systems: Denies any fevers, chills or shortness of breath.  She denies any recent changes in her health.  Physical Exam: Vital Signs BP 121/78 (BP Location: Left Arm, Patient Position: Sitting, Cuff Size: Small)   Pulse 80    Ht '5\' 6"'$  (1.676 m)   Wt 167 lb 12.8 oz (76.1 kg)   SpO2 98%   BMI 27.08 kg/m   Physical Exam  Constitutional:      General: Not in acute distress.    Appearance: Normal appearance. Not ill-appearing.  HENT:     Head: Normocephalic and atraumatic.  Neck:     Musculoskeletal: Normal range of motion.  Cardiovascular:     Rate and Rhythm: Normal rate Pulmonary:     Effort: Pulmonary effort is normal. No respiratory distress.  Musculoskeletal: Normal range of motion.  Skin:    General: Skin is warm and dry.     Findings: No erythema or rash.  Neurological:     Mental  Status: Alert and oriented to person, place, and time. Mental status is at baseline.  Psychiatric:        Mood and Affect: Mood normal.        Behavior: Behavior normal.    Assessment/Plan: The patient is scheduled for bilateral oncoplastic breast reduction with Dr.  Erin Hearing .  Risks, benefits, and alternatives of procedure discussed, questions answered and consent obtained.    Smoking Status: Non-smoker; Counseling Given?  N/A  Caprini Score: 6; Risk Factors include: Age, BMI greater than 25, history of malignancy and length of planned surgery. Recommendation for mechanical  prophylaxis. Encourage early ambulation.   Pictures obtained: '@consult'$   Post-op Rx sent to pharmacy: Oxycodone, Zofran  I instructed the patient to hold her vitamins and hold ibuprofen 1 week prior to surgery.  Patient expressed understanding.  Patient was provided with the breast reduction and General Surgical Risk consent document and Pain Medication Agreement prior to their appointment.  They had adequate time to read through the risk consent documents and Pain Medication Agreement. We also discussed them in person together during this preop appointment. All of their questions were answered to their satisfaction.  Recommended calling if they have any further questions.  Risk consent form and Pain Medication Agreement to be scanned into  patient's chart.  The risk that can be encountered with breast reduction were discussed and include the following but not limited to these:  Breast asymmetry, fluid accumulation, firmness of the breast, inability to breast feed, loss of nipple or areola, skin loss, decrease or no nipple sensation, fat necrosis of the breast tissue, bleeding, infection, healing delay.  There are risks of anesthesia, changes to skin sensation and injury to nerves or blood vessels.  The muscle can be temporarily or permanently injured.  You may have an allergic reaction to tape, suture, glue, blood products which can result in skin discoloration, swelling, pain, skin lesions, poor healing.  Any of these can lead to the need for revisonal surgery or stage procedures.  A reduction has potential to interfere with diagnostic procedures.  Nipple or breast piercing can increase risks of infection.  This procedure is best done when the breast is fully developed.  Changes in the breast will continue to occur over time.  Pregnancy can alter the outcomes of previous breast reduction surgery, weight gain and weigh loss can also effect the long term appearance.     Electronically signed by: Clance Boll, PA-C 06/04/2022 4:51 PM

## 2022-06-04 NOTE — Progress Notes (Signed)
Surgical Instructions    Your procedure is scheduled on Monday, October 23.  Report to [facility] Main Entrance "A" at 5:30 A.M., then check in with the Admitting office.  Call this number if you have problems the morning of surgery:  [phone]   If you have any questions prior to your surgery date call [phone]: Open Monday-Friday 8am-4pm If you experience any cold or flu symptoms such as cough, fever, chills, shortness of breath, etc. between now and your scheduled surgery, please notify us at the above number     Remember:  Do not eat after midnight the night before your surgery  You may drink clear liquids until 4:30AM the morning of your surgery.   Clear liquids allowed are: Water, Non-Citrus Juices (without pulp), Carbonated Beverages, Clear Tea, Black Coffee ONLY (NO MILK, CREAM OR POWDERED CREAMER of any kind), and Gatorade  Patient Instructions  The night before surgery:  No food after midnight. ONLY clear liquids after midnight  The day of surgery (if you do NOT have diabetes):  Drink ONE (1) Pre-Surgery Clear Ensure by 4:30AM the morning of surgery. Drink in one sitting. Do not sip.  This drink was given to you during your hospital  pre-op appointment visit.  Nothing else to drink after completing the  Pre-Surgery Clear Ensure.          If you have questions, please contact your surgeon's office.     Take these medicines the morning of surgery with A SIP OF WATER: NONE  You may use your Eye drops if needed   As of today, STOP taking any Aspirin (unless otherwise instructed by your surgeon) Aleve, Naproxen, Ibuprofen, Motrin, Advil, Goody's, BC's, all herbal medications, fish oil, and all vitamins.            [facility] is not responsible for any belongings or valuables.    Do NOT Smoke (Tobacco/Vaping)  24 hours prior to your procedure  If you use a CPAP at night, you may bring your mask for your overnight stay.   Contacts, glasses, hearing aids,  dentures or partials may not be worn into surgery, please bring cases for these belongings   For patients admitted to the hospital, discharge time will be determined by your treatment team.   Patients discharged the day of surgery will not be allowed to drive home, and someone needs to stay with them for 24 hours.   SURGICAL WAITING ROOM VISITATION Patients having surgery or a procedure may have no more than 2 support people in the waiting area - these visitors may rotate.   Children under the age of 60 must have an adult with them who is not the patient. If the patient needs to stay at the hospital during part of their recovery, the visitor guidelines for inpatient rooms apply. Pre-op nurse will coordinate an appropriate time for 1 support person to accompany patient in pre-op.  This support person may not rotate.   Please refer to [email or web address] for the visitor guidelines for Inpatients (after your surgery is over and you are in a regular room).    Special instructions:    Oral Hygiene is also important to reduce your risk of infection.  Remember - BRUSH YOUR TEETH THE MORNING OF SURGERY WITH YOUR REGULAR TOOTHPASTE   [facility]- Preparing For Surgery  Before surgery, you can play an important role. Because skin is not sterile, your skin needs to be as free of germs as possible. You can reduce the  number of germs on your skin by washing with CHG (chlorahexidine gluconate) Soap before surgery.  CHG is an antiseptic cleaner which kills germs and bonds with the skin to continue killing germs even after washing.     Please do not use if you have an allergy to CHG or antibacterial soaps. If your skin becomes reddened/irritated stop using the CHG.  Do not shave (including legs and underarms) for at least 48 hours prior to first CHG shower. It is OK to shave your face.  Please follow these instructions carefully.     Shower the NIGHT  BEFORE SURGERY and the MORNING OF SURGERY with CHG Soap.   If you chose to wash your hair, wash your hair first as usual with your normal shampoo. After you shampoo, rinse your hair and body thoroughly to remove the shampoo.  Then [employer] and genitals (private parts) with your normal soap and rinse thoroughly to remove soap.  After that Use CHG Soap as you would any other liquid soap. You can apply CHG directly to the skin and wash gently with a scrungie or a clean washcloth.   Apply the CHG Soap to your body ONLY FROM THE NECK DOWN.  Do not use on open wounds or open sores. Avoid contact with your eyes, ears, mouth and genitals (private parts). Wash Face and genitals (private parts)  with your normal soap.   Wash thoroughly, paying special attention to the area where your surgery will be performed.  Thoroughly rinse your body with warm water from the neck down.  DO NOT shower/wash with your normal soap after using and rinsing off the CHG Soap.  Pat yourself dry with a CLEAN TOWEL.  Wear CLEAN PAJAMAS to bed the night before surgery  Place CLEAN SHEETS on your bed the night before your surgery  DO NOT SLEEP WITH PETS.   Day of Surgery:  Take a shower with CHG soap. Wear Clean/Comfortable clothing the morning of surgery Do not wear jewelry or makeup. Do not wear lotions, powders, perfumes/cologne or deodorant. Do not shave 48 hours prior to surgery.  Men may shave face and neck. Do not bring valuables to the hospital. Do not wear nail polish, gel polish, artificial nails, or any other type of covering on natural nails (fingers and toes) If you have artificial nails or gel coating that need to be removed by a nail salon, please have this removed prior to surgery. Artificial nails or gel coating may interfere with anesthesia's ability to adequately monitor your vital signs. Remember to brush your teeth WITH YOUR REGULAR TOOTHPASTE.    If you received a COVID test during your pre-op  visit, it is requested that you wear a mask when out in public, stay away from anyone that may not be feeling well, and notify your surgeon if you develop symptoms. If you have been in contact with anyone that has tested positive in the last 10 days, please notify your surgeon.    Please read over the following fact sheets that you were given.

## 2022-06-04 NOTE — Progress Notes (Incomplete)
 Patient Care Team: Morrow, Aaron, MD as PCP - General (Family Medicine) Stuart, Dawn C, RN as Oncology Nurse Navigator Martini, Keisha N, RN as Oncology Nurse Navigator Blackman, Douglas, MD as Consulting Physician (General Surgery) Gudena, Vinay, MD as Consulting Physician (Hematology and Oncology) Kinard, James, MD as Consulting Physician (Radiation Oncology)  DIAGNOSIS: No diagnosis found.  SUMMARY OF ONCOLOGIC HISTORY: Oncology History  Malignant neoplasm of upper-outer quadrant of right breast in female, estrogen receptor positive ([facility])  04/03/2022 Initial Diagnosis   Palpable right breast mass UOQ, by ultrasound at 11 o'clock position 1.9 cm mass which on biopsy was grade 3 IDC ER 60%, PR 0%, Ki-67 60%, HER2 negative, 1 lymph node in the axilla: Biopsy positive   04/15/2022 Cancer Staging   Staging form: Breast, AJCC 8th Edition - Clinical: Stage IIB (cT1c, cN1, cM0, G3, ER+, PR-, HER2-) - Signed by Gudena, Vinay, MD on 04/15/2022 Stage prefix: Initial diagnosis Histologic grading system: 3 grade system    Genetic Testing   [facility] CancerNext-Expanded Panel was Negative. 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 Report date is 04/24/2022.  The CancerNext-Expanded gene panel offered by [facility] and includes sequencing, rearrangement, and RNA analysis for the following 77 genes: AIP, ALK, APC, ATM, AXIN2, BAP1, BARD1, BLM, BMPR1A, BRCA1, BRCA2, BRIP1, CDC73, CDH1, CDK4, CDKN1B, CDKN2A, CHEK2, CTNNA1, DICER1, FANCC, FH, FLCN, GALNT12, KIF1B, LZTR1, MAX, MEN1, MET, MLH1, MSH2, MSH3, MSH6, MUTYH, NBN, NF1, NF2, NTHL1, PALB2, PHOX2B, PMS2, POT1, PRKAR1A, PTCH1, PTEN, RAD51C, RAD51D, RB1, RECQL, RET, SDHA, SDHAF2, SDHB, SDHC, SDHD, SMAD4, SMARCA4, SMARCB1, SMARCE1, STK11, SUFU, TMEM127, TP53, TSC1, TSC2, VHL and XRCC2 (sequencing and deletion/duplication); EGFR, EGLN1, HOXB13, KIT, MITF, PDGFRA, POLD1, and POLE (sequencing only); EPCAM and  GREM1 (deletion/duplication only).    06/02/2022 -  Chemotherapy   Patient is on Treatment Plan : BREAST Pembrolizumab (200) D1 + Carboplatin (1.5) D1,8,15 + Paclitaxel (80) D1,8,15 q21d X 4 cycles / Pembrolizumab (200) D1 + AC D1 q21d x 4 cycles       CHIEF COMPLIANT:   INTERVAL HISTORY: Colleen Montgomery is a   ALLERGIES:  has No Known Allergies.  MEDICATIONS:  Current Outpatient Medications  Medication Sig Dispense Refill   carboxymethylcellulose (REFRESH TEARS) 0.5 % SOLN Place 1 drop into both eyes daily.     dexamethasone (DECADRON) 4 MG tablet Take 1 tablet day before Adriamycin and Cytoxan and 1 tablet day after chemo with food (Patient not taking: Reported on 06/02/2022) 8 tablet 0   lidocaine-prilocaine (EMLA) cream Apply to affected area once (Patient not taking: Reported on 06/02/2022) 30 g 3   NONFORMULARY OR COMPOUNDED ITEM Take 1 capsule by mouth daily. [facility]  Lioehyronine sodium (T3 ) 12 mcg Thyroxine/L (T4) 40 mcg     ondansetron (ZOFRAN) 8 MG tablet Take 1 tablet (8 mg total) by mouth every 8 (eight) hours as needed for nausea or vomiting. Start on the third day after chemotherapy. (Patient not taking: Reported on 06/02/2022) 30 tablet 1   prochlorperazine (COMPAZINE) 10 MG tablet Take 1 tablet (10 mg total) by mouth every 6 (six) hours as needed for nausea or vomiting. (Patient not taking: Reported on 06/02/2022) 30 tablet 1   Vitamin D-Vitamin K (K2 PLUS D3 PO) Take 1 tablet by mouth daily.     No current facility-administered medications for this visit.    PHYSICAL EXAMINATION: ECOG PERFORMANCE STATUS: {CHL ONC ECOG PS:[id number]}  There were no vitals filed   for this visit. There were no vitals filed for this visit.  BREAST:*** No palpable masses or nodules in either right or left breasts. No palpable axillary supraclavicular or infraclavicular adenopathy no breast tenderness or nipple discharge. (exam performed in the presence of a  chaperone)  LABORATORY DATA:  I have reviewed the data as listed    Latest Ref Rng & Units 04/15/2022   12:33 PM 05/27/2019    5:06 AM  CMP  Glucose 70 - 99 mg/dL 83  102   BUN 6 - 20 mg/dL 12  21   Creatinine 0.44 - 1.00 mg/dL 0.92  1.12   Sodium 135 - 145 mmol/L 140  140   Potassium 3.5 - 5.1 mmol/L 3.9  3.6   Chloride 98 - 111 mmol/L 106  106   CO2 22 - 32 mmol/L 28  25   Calcium 8.9 - 10.3 mg/dL 9.9  9.1   Total Protein 6.5 - 8.1 g/dL 7.1  7.4   Total Bilirubin 0.3 - 1.2 mg/dL 0.5  0.6   Alkaline Phos 38 - 126 U/L 61  69   AST 15 - 41 U/L 18  19   ALT 0 - 44 U/L 16  19     Lab Results  Component Value Date   WBC 4.5 04/15/2022   HGB 12.6 04/15/2022   HCT 37.2 04/15/2022   MCV 85.3 04/15/2022   PLT 235 04/15/2022   NEUTROABS 2.9 04/15/2022    ASSESSMENT & PLAN:  No problem-specific Assessment & Plan notes found for this encounter.    No orders of the defined types were placed in this encounter.  The patient has a good understanding of the overall plan. she agrees with it. she will call with any problems that may develop before the next visit here. Total time spent: 30 mins including face to face time and time spent for planning, charting and co-ordination of care   Suzzette Righter, [place] 06/04/22    I Gardiner Coins am scribing for Dr. [name]  ***

## 2022-06-05 ENCOUNTER — Ambulatory Visit

## 2022-06-05 ENCOUNTER — Other Ambulatory Visit: Payer: Self-pay | Admitting: Surgery

## 2022-06-05 ENCOUNTER — Other Ambulatory Visit

## 2022-06-05 ENCOUNTER — Encounter (HOSPITAL_COMMUNITY)
Admission: RE | Admit: 2022-06-05 | Discharge: 2022-06-05 | Disposition: A | Discharge status: Home / Self Care | Source: Ambulatory Visit | Attending: Orthopaedic Surgery | Admitting: Orthopaedic Surgery

## 2022-06-05 ENCOUNTER — Ambulatory Visit
Admission: RE | Admit: 2022-06-05 | Discharge: 2022-06-05 | Disposition: A | Discharge status: Home / Self Care | Source: Ambulatory Visit | Attending: Surgery | Admitting: Surgery

## 2022-06-05 ENCOUNTER — Encounter (HOSPITAL_COMMUNITY): Payer: Self-pay

## 2022-06-05 ENCOUNTER — Other Ambulatory Visit: Payer: Self-pay

## 2022-06-05 ENCOUNTER — Other Ambulatory Visit: Payer: Self-pay | Admitting: Hematology and Oncology

## 2022-06-05 ENCOUNTER — Ambulatory Visit: Admitting: Hematology and Oncology

## 2022-06-05 VITALS — BP 107/79 | HR 93 | Temp 98.2°F | Resp 18 | Ht 66.0 in | Wt 168.3 lb

## 2022-06-05 DIAGNOSIS — Z01818 Encounter for other preprocedural examination: Secondary | ICD-10-CM

## 2022-06-05 DIAGNOSIS — Z853 Personal history of malignant neoplasm of breast: Secondary | ICD-10-CM

## 2022-06-05 DIAGNOSIS — Z01812 Encounter for preprocedural laboratory examination: Secondary | ICD-10-CM | POA: Insufficient documentation

## 2022-06-05 HISTORY — DX: Hypothyroidism, unspecified: E03.9

## 2022-06-05 LAB — CBC
HCT: 37.6 % (ref 36.0–46.0)
Hemoglobin: 12.4 g/dL (ref 12.0–15.0)
MCH: 28.8 pg (ref 26.0–34.0)
MCHC: 33 g/dL (ref 30.0–36.0)
MCV: 87.4 fL (ref 80.0–100.0)
Platelets: 213 10*3/uL (ref 150–400)
RBC: 4.3 MIL/uL (ref 3.87–5.11)
RDW: 12.7 % (ref 11.5–15.5)
WBC: 5.5 10*3/uL (ref 4.0–10.5)
nRBC: 0 % (ref 0.0–0.2)

## 2022-06-05 NOTE — Progress Notes (Signed)
PCP - aaron morrow Cardiologist - denies  PPM/ICD - denies   Chest x-ray - n/a EKG - n/a Stress Test -denies  ECHO - denies Cardiac Cath - denies  Sleep Study - denies   Follow your surgeon's instructions on when to stop Aspirin.  If no instructions were given by your surgeon then you will need to call the office to get those instructions.     ERAS Protcol -yes PRE-SURGERY Ensure or G2- ensure ordered and give  COVID TEST- not needed   Anesthesia review: yes, seed placement   Patient denies shortness of breath, fever, cough and chest pain at PAT appointment   All instructions explained to the patient, with a verbal understanding of the material. Patient agrees to go over the instructions while at home for a better understanding. Patient also instructed to self quarantine after being tested for COVID-19. The opportunity to ask questions was provided.

## 2022-06-07 ENCOUNTER — Encounter (HOSPITAL_COMMUNITY): Payer: Self-pay | Admitting: Surgery

## 2022-06-07 NOTE — H&P (Signed)
REFERRING PHYSICIAN: Rulon Eisenmenger, MD  PROVIDER: Beverlee Nims, MD  MRN: [id number] DOB: Nov 27, 1965  Subjective   Chief Complaint: Breast Cancer   History of Present Illness: Colleen Montgomery is a 56 y.o. female who is seen as an office consultation for evaluation of Breast Cancer .   This is a pleasant 56 year old female who palpated a mass in her right breast approximately 1 month ago. She underwent mammograms and ultrasound showing a 1.9 x 1.8 x 1.6 cm mass in the upper outer quadrant of the right breast. She underwent a biopsy showed invasive right breast carcinoma. It was 60% ER positive, PR negative, HER2 negative, with a Ki-67 of 60%. She underwent a biopsy of a lymph node in the right axilla which was positive for metastatic cancer. Has a family history of breast cancer in her maternal grandmother. She has had no previous problems regarding her breast. Denies nipple discharge. She has no cardiopulmonary issues  Review of Systems: A complete review of systems was obtained from the patient. I have reviewed this information and discussed as appropriate with the patient. See HPI as well for other ROS.  ROS   Medical History: Past Medical History:  Diagnosis Date  Anxiety  Arthritis  Asthma, unspecified asthma severity, unspecified whether complicated, unspecified whether persistent  Thyroid disease   Patient Active Problem List  Diagnosis  Malignant neoplasm of upper-outer quadrant of right breast in female, estrogen receptor positive (CMS-HCC)   Past Surgical History:  Procedure Laterality Date  APPENDECTOMY N/A  1993  Fractured Femur N/A  1972  LAPAROSCOPY FOR ECTOPIC PREGNANCY N/A  1991    No Known Allergies  Current Outpatient Medications on File Prior to Visit  Medication Sig Dispense Refill  triamcinolone 0.1 % ointment APPLY TO THE AFFECTED AREA TWICE DAILY ON AND OFF AS NEEDED FOR RASH ONLY.  levothyroxine (SYNTHROID) 25 MCG tablet Take 25 mcg by  mouth once daily Take on an empty stomach with a glass of water at least 30-60 minutes before breakfast.  liothyronine (CYTOMEL) 25 MCG tablet Take 25 mcg by mouth once daily Take on an empty stomach with a glass of water at least 30-60 minutes before breakfast.   No current facility-administered medications on file prior to visit.   Family History  Problem Relation Age of Onset  Prostate cancer Father    Social History   Tobacco Use  Smoking Status Never  Smokeless Tobacco Never    Social History   Socioeconomic History  Marital status: Married  Tobacco Use  Smoking status: Never  Smokeless tobacco: Never  Vaping Use  Vaping Use: Never used  Substance and Sexual Activity  Alcohol use: Never  Drug use: Never   Objective:  There were no vitals filed for this visit.  There is no height or weight on file to calculate BMI.  Physical Exam   She appears well on exam  She has very large breast bilaterally.  In the upper outer quadrant of the right breast approximately 7 cm from the nipple is a palpable mass which is mobile. It is less than 2 cm in size. There is no palpable axillary adenopathy. The nipple areolar complex is normal  Labs, Imaging and Diagnostic Testing: I reviewed her mammograms, ultrasound, and pathology results  Assessment and Plan:   Diagnoses and all orders for this visit:  Invasive ductal carcinoma of breast, female, right (CMS-HCC)    We have discussed her in our multidisciplinary breast cancer conference. I discussed surgical options  regarding breast cancer. We discussed both lumpectomy and mastectomy. Given her very large breast as well as the large size of her breast cancer, she would be a candidate for oncoplastic reduction of her breast at the time of lumpectomy. I discussed this with her in detail and she is very interested in discussing this with plastic surgeons as well. We would also do a targeted lymph node dissection in the right axilla  to remove the positive node and some surrounding lymph nodes. I would inject mag trace for lymph node mapping as well. I explained the total surgical procedure to her in detail. We discussed the risk which includes but is not limited to bleeding, infection, injury to surrounding structures, chronic arm swelling, seroma formation, postoperative recovery, cardiopulmonary issues, etc. She understands and wished to proceed with surgery soon as possible. She Colleen be referred to plastic surgery to consider oncoplastic reduction.

## 2022-06-07 NOTE — Anesthesia Preprocedure Evaluation (Signed)
Anesthesia Evaluation  Patient identified by MRN, date of birth, ID band Patient awake    Reviewed: Allergy & Precautions, NPO status , Patient's Chart, lab work & pertinent test results  Airway Mallampati: II  TM Distance: >3 FB Neck ROM: Full    Dental  (+) Caps, Dental Advisory Given, Teeth Intact   Pulmonary neg pulmonary ROS,    Pulmonary exam normal breath sounds clear to auscultation       Cardiovascular negative cardio ROS Normal cardiovascular exam Rhythm:Regular Rate:Normal     Neuro/Psych negative neurological ROS  negative psych ROS   GI/Hepatic Neg liver ROS, diverticulosis   Endo/Other  Hypothyroidism Right breast ca  Renal/GU negative Renal ROS  negative genitourinary   Musculoskeletal negative musculoskeletal ROS (+)   Abdominal   Peds  Hematology negative hematology ROS (+)   Anesthesia Other Findings   Reproductive/Obstetrics                            Anesthesia Physical Anesthesia Plan  ASA: 2  Anesthesia Plan: General   Post-op Pain Management: Regional block*, Minimal or no pain anticipated, Precedex, Tylenol PO (pre-op)* and Dilaudid IV   Induction: Intravenous  PONV Risk Score and Plan: Treatment may vary due to age or medical condition, Ondansetron, Midazolam, Scopolamine patch - Pre-op and Dexamethasone  Airway Management Planned: LMA  Additional Equipment: None  Intra-op Plan:   Post-operative Plan: Extubation in OR  Informed Consent: I have reviewed the patients History and Physical, chart, labs and discussed the procedure including the risks, benefits and alternatives for the proposed anesthesia with the patient or authorized representative who has indicated his/her understanding and acceptance.     Dental advisory given  Plan Discussed with: Anesthesiologist and CRNA  Anesthesia Plan Comments:        Anesthesia Quick Evaluation

## 2022-06-08 ENCOUNTER — Ambulatory Visit
Admission: RE | Admit: 2022-06-08 | Discharge: 2022-06-08 | Disposition: A | Discharge status: Home / Self Care | Source: Ambulatory Visit | Attending: Surgery | Admitting: Surgery

## 2022-06-08 ENCOUNTER — Encounter (HOSPITAL_COMMUNITY): Payer: Self-pay | Admitting: Surgery

## 2022-06-08 ENCOUNTER — Ambulatory Visit (HOSPITAL_COMMUNITY): Admitting: Physician Assistant

## 2022-06-08 ENCOUNTER — Other Ambulatory Visit: Payer: Self-pay

## 2022-06-08 ENCOUNTER — Encounter (HOSPITAL_COMMUNITY)
Admission: RE | Disposition: A | Discharge status: Home / Self Care | Payer: Self-pay | Source: Home / Self Care | Attending: Surgery

## 2022-06-08 ENCOUNTER — Ambulatory Visit (HOSPITAL_COMMUNITY)
Admission: RE | Admit: 2022-06-08 | Discharge: 2022-06-08 | Disposition: A | Discharge status: Home / Self Care | Attending: Surgery | Admitting: Surgery

## 2022-06-08 ENCOUNTER — Ambulatory Visit
Admit: 2022-06-08 | Discharge: 2022-06-08 | Disposition: A | Discharge status: Home / Self Care | Attending: Surgery | Admitting: Surgery

## 2022-06-08 ENCOUNTER — Ambulatory Visit (HOSPITAL_BASED_OUTPATIENT_CLINIC_OR_DEPARTMENT_OTHER): Admitting: Physician Assistant

## 2022-06-08 DIAGNOSIS — C50411 Malignant neoplasm of upper-outer quadrant of right female breast: Secondary | ICD-10-CM | POA: Diagnosis not present

## 2022-06-08 DIAGNOSIS — N62 Hypertrophy of breast: Secondary | ICD-10-CM

## 2022-06-08 DIAGNOSIS — E039 Hypothyroidism, unspecified: Secondary | ICD-10-CM | POA: Diagnosis not present

## 2022-06-08 DIAGNOSIS — Z170 Estrogen receptor positive status [ER+]: Secondary | ICD-10-CM | POA: Diagnosis not present

## 2022-06-08 DIAGNOSIS — C773 Secondary and unspecified malignant neoplasm of axilla and upper limb lymph nodes: Secondary | ICD-10-CM | POA: Diagnosis not present

## 2022-06-08 DIAGNOSIS — C50811 Malignant neoplasm of overlapping sites of right female breast: Secondary | ICD-10-CM

## 2022-06-08 DIAGNOSIS — Z8719 Personal history of other diseases of the digestive system: Secondary | ICD-10-CM | POA: Insufficient documentation

## 2022-06-08 DIAGNOSIS — C50911 Malignant neoplasm of unspecified site of right female breast: Secondary | ICD-10-CM

## 2022-06-08 HISTORY — PX: BREAST REDUCTION SURGERY: SHX8

## 2022-06-08 HISTORY — PX: BREAST LUMPECTOMY: SHX2

## 2022-06-08 HISTORY — PX: RADIOACTIVE SEED GUIDED AXILLARY SENTINEL LYMPH NODE: SHX6735

## 2022-06-08 SURGERY — BREAST LUMPECTOMY
Anesthesia: General | Site: Breast | Laterality: Right

## 2022-06-08 MED ORDER — DEXAMETHASONE SODIUM PHOSPHATE 10 MG/ML IJ SOLN
INTRAMUSCULAR | Status: AC
  Filled 2022-06-08: qty 1

## 2022-06-08 MED ORDER — LACTATED RINGERS IV SOLN: INTRAVENOUS | Status: DC

## 2022-06-08 MED ORDER — OXYCODONE HCL 5 MG PO TABS
5.0000 mg | ORAL_TABLET | Freq: Once | ORAL | Status: AC | PRN
  Administered 2022-06-08: 5 mg via ORAL

## 2022-06-08 MED ORDER — ACETAMINOPHEN 500 MG PO TABS
1000.0000 mg | ORAL_TABLET | ORAL | Status: AC
  Administered 2022-06-08: 1000 mg via ORAL
  Filled 2022-06-08: qty 2

## 2022-06-08 MED ORDER — EPHEDRINE 5 MG/ML INJ
INTRAVENOUS | Status: AC
  Filled 2022-06-08: qty 5

## 2022-06-08 MED ORDER — CHLORHEXIDINE GLUCONATE 0.12 % MT SOLN
15.0000 mL | Freq: Once | OROMUCOSAL | Status: AC
  Administered 2022-06-08: 15 mL via OROMUCOSAL
  Filled 2022-06-08: qty 15

## 2022-06-08 MED ORDER — FENTANYL CITRATE (PF) 100 MCG/2ML IJ SOLN
INTRAMUSCULAR | Status: AC
  Filled 2022-06-08: qty 2

## 2022-06-08 MED ORDER — OXYCODONE HCL 5 MG/5ML PO SOLN: 5.0000 mg | Freq: Once | ORAL | Status: AC | PRN

## 2022-06-08 MED ORDER — MIDAZOLAM HCL 5 MG/5ML IJ SOLN
INTRAMUSCULAR | Status: DC | PRN
  Administered 2022-06-08: 2 mg via INTRAVENOUS

## 2022-06-08 MED ORDER — FENTANYL CITRATE (PF) 100 MCG/2ML IJ SOLN
25.0000 ug | INTRAMUSCULAR | Status: DC | PRN
  Administered 2022-06-08 (×4): 25 ug via INTRAVENOUS

## 2022-06-08 MED ORDER — SODIUM CHLORIDE (PF) 0.9 % IJ SOLN
INTRAMUSCULAR | Status: DC | PRN
  Administered 2022-06-08: 100 mL

## 2022-06-08 MED ORDER — EPINEPHRINE PF 1 MG/ML IJ SOLN
INTRAMUSCULAR | Status: AC
  Filled 2022-06-08: qty 1

## 2022-06-08 MED ORDER — DEXAMETHASONE SODIUM PHOSPHATE 10 MG/ML IJ SOLN
INTRAMUSCULAR | Status: DC | PRN
  Administered 2022-06-08: 10 mg via INTRAVENOUS

## 2022-06-08 MED ORDER — BUPIVACAINE-EPINEPHRINE (PF) 0.25% -1:200000 IJ SOLN
INTRAMUSCULAR | Status: AC
  Filled 2022-06-08: qty 30

## 2022-06-08 MED ORDER — ONDANSETRON HCL 4 MG/2ML IJ SOLN: 4.0000 mg | Freq: Once | INTRAMUSCULAR | Status: DC | PRN

## 2022-06-08 MED ORDER — SCOPOLAMINE 1 MG/3DAYS TD PT72
1.0000 | MEDICATED_PATCH | TRANSDERMAL | Status: DC
  Administered 2022-06-08: 1.5 mg via TRANSDERMAL
  Filled 2022-06-08: qty 1

## 2022-06-08 MED ORDER — BUPIVACAINE HCL (PF) 0.5 % IJ SOLN
INTRAMUSCULAR | Status: DC | PRN
  Administered 2022-06-08: 20 mL via PERINEURAL

## 2022-06-08 MED ORDER — ONDANSETRON HCL 4 MG/2ML IJ SOLN
INTRAMUSCULAR | Status: DC | PRN
  Administered 2022-06-08 (×2): 4 mg via INTRAVENOUS

## 2022-06-08 MED ORDER — CEFAZOLIN SODIUM-DEXTROSE 2-4 GM/100ML-% IV SOLN
2.0000 g | INTRAVENOUS | Status: AC
  Administered 2022-06-08: 2 g via INTRAVENOUS
  Filled 2022-06-08: qty 100

## 2022-06-08 MED ORDER — MIDAZOLAM HCL 2 MG/2ML IJ SOLN
INTRAMUSCULAR | Status: AC
  Filled 2022-06-08: qty 2

## 2022-06-08 MED ORDER — CHLORHEXIDINE GLUCONATE CLOTH 2 % EX PADS: 6.0000 | MEDICATED_PAD | Freq: Once | CUTANEOUS | Status: DC

## 2022-06-08 MED ORDER — 0.9 % SODIUM CHLORIDE (POUR BTL) OPTIME
TOPICAL | Status: DC | PRN
  Administered 2022-06-08: 1000 mL

## 2022-06-08 MED ORDER — FENTANYL CITRATE (PF) 100 MCG/2ML IJ SOLN
INTRAMUSCULAR | Status: DC | PRN
  Administered 2022-06-08: 50 ug via INTRAVENOUS
  Administered 2022-06-08 (×8): 25 ug via INTRAVENOUS

## 2022-06-08 MED ORDER — ONDANSETRON HCL 4 MG/2ML IJ SOLN
INTRAMUSCULAR | Status: AC
  Filled 2022-06-08: qty 2

## 2022-06-08 MED ORDER — ENSURE PRE-SURGERY PO LIQD: 296.0000 mL | Freq: Once | ORAL | Status: DC

## 2022-06-08 MED ORDER — FENTANYL CITRATE (PF) 250 MCG/5ML IJ SOLN
INTRAMUSCULAR | Status: AC
  Filled 2022-06-08: qty 5

## 2022-06-08 MED ORDER — BUPIVACAINE LIPOSOME 1.3 % IJ SUSP
INTRAMUSCULAR | Status: DC | PRN
  Administered 2022-06-08: 10 mL via PERINEURAL

## 2022-06-08 MED ORDER — EPHEDRINE SULFATE-NACL 50-0.9 MG/10ML-% IV SOSY
PREFILLED_SYRINGE | INTRAVENOUS | Status: DC | PRN
  Administered 2022-06-08: 10 mg via INTRAVENOUS
  Administered 2022-06-08: 5 mg via INTRAVENOUS
  Administered 2022-06-08: 10 mg via INTRAVENOUS

## 2022-06-08 MED ORDER — ORAL CARE MOUTH RINSE: 15.0000 mL | Freq: Once | OROMUCOSAL | Status: AC

## 2022-06-08 MED ORDER — EPINEPHRINE PF 1 MG/ML IJ SOLN
INTRAMUSCULAR | Status: DC | PRN
  Administered 2022-06-08: 1 mg via SUBCUTANEOUS

## 2022-06-08 MED ORDER — LIDOCAINE 2% (20 MG/ML) 5 ML SYRINGE
INTRAMUSCULAR | Status: AC
  Filled 2022-06-08: qty 5

## 2022-06-08 MED ORDER — OXYCODONE HCL 5 MG PO TABS
ORAL_TABLET | ORAL | Status: AC
  Filled 2022-06-08: qty 1

## 2022-06-08 MED ORDER — LIDOCAINE 2% (20 MG/ML) 5 ML SYRINGE
INTRAMUSCULAR | Status: DC | PRN
  Administered 2022-06-08: 60 mg via INTRAVENOUS

## 2022-06-08 MED ORDER — PROPOFOL 10 MG/ML IV BOLUS
INTRAVENOUS | Status: DC | PRN
  Administered 2022-06-08: 160 mg via INTRAVENOUS

## 2022-06-08 MED ORDER — AMISULPRIDE (ANTIEMETIC) 5 MG/2ML IV SOLN: 10.0000 mg | Freq: Once | INTRAVENOUS | Status: DC | PRN

## 2022-06-08 SURGICAL SUPPLY — 82 items
APPLIER CLIP 9.375 MED OPEN (MISCELLANEOUS) ×2
BAG COUNTER SPONGE SURGICOUNT (BAG) ×2 IMPLANT
BENZOIN TINCTURE PRP APPL 2/3 (GAUZE/BANDAGES/DRESSINGS) ×4 IMPLANT
BINDER BREAST LRG (GAUZE/BANDAGES/DRESSINGS) IMPLANT
BINDER BREAST XLRG (GAUZE/BANDAGES/DRESSINGS) IMPLANT
BIOPATCH RED 1 DISK 7.0 (GAUZE/BANDAGES/DRESSINGS) ×4 IMPLANT
BLADE SURG 10 STRL SS (BLADE) ×10 IMPLANT
BLADE SURG 15 STRL LF DISP TIS (BLADE) IMPLANT
BLADE SURG 15 STRL SS (BLADE)
BNDG GAUZE DERMACEA FLUFF 4 (GAUZE/BANDAGES/DRESSINGS) ×4 IMPLANT
CANISTER SUCT 3000ML PPV (MISCELLANEOUS) ×2 IMPLANT
CHLORAPREP W/TINT 26 (MISCELLANEOUS) ×6 IMPLANT
CLIP APPLIE 9.375 MED OPEN (MISCELLANEOUS) ×2 IMPLANT
COVER PROBE W GEL 5X96 (DRAPES) ×2 IMPLANT
COVER SURGICAL LIGHT HANDLE (MISCELLANEOUS) ×2 IMPLANT
DERMABOND ADVANCED .7 DNX12 (GAUZE/BANDAGES/DRESSINGS) ×2 IMPLANT
DEVICE DISSECT PLASMABLAD 3.0S (MISCELLANEOUS) IMPLANT
DEVICE DUBIN SPECIMEN MAMMOGRA (MISCELLANEOUS) ×2 IMPLANT
DRAIN CHANNEL 15F RND FF W/TCR (WOUND CARE) ×4 IMPLANT
DRAPE CHEST BREAST 15X10 FENES (DRAPES) ×2 IMPLANT
DRAPE HALF SHEET 40X57 (DRAPES) ×4 IMPLANT
DRAPE ORTHO SPLIT 77X108 STRL (DRAPES) ×4
DRAPE SURG ORHT 6 SPLT 77X108 (DRAPES) ×4 IMPLANT
DRESSING MEPILEX FLEX 4X4 (GAUZE/BANDAGES/DRESSINGS) ×4 IMPLANT
DRSG MEPILEX BORDER 4X12 (GAUZE/BANDAGES/DRESSINGS) ×4 IMPLANT
DRSG MEPILEX FLEX 4X4 (GAUZE/BANDAGES/DRESSINGS) ×2
DRSG MEPILEX POST OP 4X12 (GAUZE/BANDAGES/DRESSINGS) IMPLANT
DRSG MEPILEX SACRM 8.7X9.8 (GAUZE/BANDAGES/DRESSINGS) ×2 IMPLANT
DRSG MEPITEL 4X7.2 (GAUZE/BANDAGES/DRESSINGS) IMPLANT
DRSG MEPITEL 8X12 (GAUZE/BANDAGES/DRESSINGS) IMPLANT
DRSG TEGADERM 4X4.75 (GAUZE/BANDAGES/DRESSINGS) ×4 IMPLANT
ELECT BLADE 4.0 EZ CLEAN MEGAD (MISCELLANEOUS)
ELECT CAUTERY BLADE 6.4 (BLADE) ×2 IMPLANT
ELECT REM PT RETURN 9FT ADLT (ELECTROSURGICAL) ×4
ELECTRODE BLDE 4.0 EZ CLN MEGD (MISCELLANEOUS) IMPLANT
ELECTRODE REM PT RTRN 9FT ADLT (ELECTROSURGICAL) ×4 IMPLANT
EVACUATOR SILICONE 100CC (DRAIN) ×4 IMPLANT
GAUZE PAD ABD 8X10 STRL (GAUZE/BANDAGES/DRESSINGS) IMPLANT
GLOVE BIO SURGEON STRL SZ 6.5 (GLOVE) IMPLANT
GLOVE BIO SURGEON STRL SZ7.5 (GLOVE) ×2 IMPLANT
GLOVE BIOGEL M STRL SZ7.5 (GLOVE) ×2 IMPLANT
GLOVE BIOGEL PI IND STRL 7.5 (GLOVE) ×2 IMPLANT
GLOVE ECLIPSE 6.5 STRL STRAW (GLOVE) IMPLANT
GLOVE SURG SIGNA 7.5 PF LTX (GLOVE) ×2 IMPLANT
GOWN STRL REUS W/ TWL LRG LVL3 (GOWN DISPOSABLE) ×6 IMPLANT
GOWN STRL REUS W/ TWL XL LVL3 (GOWN DISPOSABLE) ×2 IMPLANT
GOWN STRL REUS W/TWL LRG LVL3 (GOWN DISPOSABLE) ×6
GOWN STRL REUS W/TWL XL LVL3 (GOWN DISPOSABLE) ×4 IMPLANT
KIT BASIN OR (CUSTOM PROCEDURE TRAY) ×4 IMPLANT
KIT MARKER MARGIN INK (KITS) ×2 IMPLANT
MARKER SKIN DUAL TIP RULER LAB (MISCELLANEOUS) ×2 IMPLANT
NDL HYPO 25GX1X1/2 BEV (NEEDLE) ×2 IMPLANT
NDL SAFETY ECLIPSE 18X1.5 (NEEDLE) IMPLANT
NDL SPNL 18GX3.5 QUINCKE PK (NEEDLE) ×2 IMPLANT
NEEDLE HYPO 18GX1.5 SHARP (NEEDLE)
NEEDLE HYPO 25GX1X1/2 BEV (NEEDLE) ×2 IMPLANT
NEEDLE SPNL 18GX3.5 QUINCKE PK (NEEDLE) ×2 IMPLANT
NS IRRIG 1000ML POUR BTL (IV SOLUTION) ×2 IMPLANT
PACK GENERAL/GYN (CUSTOM PROCEDURE TRAY) ×4 IMPLANT
PENCIL SMOKE EVACUATOR (MISCELLANEOUS) IMPLANT
PIN SAFETY STERILE (MISCELLANEOUS) ×2 IMPLANT
PLASMABLADE 3.0S (MISCELLANEOUS)
SPONGE T-LAP 18X18 ~~LOC~~+RFID (SPONGE) IMPLANT
STAPLER INSORB 30 2030 C-SECTI (MISCELLANEOUS) ×4 IMPLANT
STAPLER VISISTAT 35W (STAPLE) ×2 IMPLANT
STRIP CLOSURE SKIN 1/2X4 (GAUZE/BANDAGES/DRESSINGS) ×6 IMPLANT
SUT MNCRL AB 4-0 PS2 18 (SUTURE) ×6 IMPLANT
SUT MON AB 5-0 PS2 18 (SUTURE) ×2 IMPLANT
SUT PDS AB 3-0 SH 27 (SUTURE) ×8 IMPLANT
SUT PDS AB 4-0 SH 27 (SUTURE) ×6 IMPLANT
SUT SILK 2 0 SH (SUTURE) ×4 IMPLANT
SUT VIC AB 2-0 SH 27 (SUTURE) ×4
SUT VIC AB 2-0 SH 27XBRD (SUTURE) ×4 IMPLANT
SUT VIC AB 3-0 SH 18 (SUTURE) ×2 IMPLANT
SUT VLOC 90 P-14 23 (SUTURE) ×4 IMPLANT
SYR 50ML LL SCALE MARK (SYRINGE) IMPLANT
SYR CONTROL 10ML LL (SYRINGE) ×2 IMPLANT
TOWEL GREEN STERILE (TOWEL DISPOSABLE) ×2 IMPLANT
TOWEL GREEN STERILE FF (TOWEL DISPOSABLE) ×4 IMPLANT
TRAY FOLEY W/BAG SLVR 16FR (SET/KITS/TRAYS/PACK) ×2
TRAY FOLEY W/BAG SLVR 16FR ST (SET/KITS/TRAYS/PACK) IMPLANT
TUBING INFILTRATION IT-10001 (TUBING) ×2 IMPLANT

## 2022-06-08 NOTE — Anesthesia Procedure Notes (Signed)
Procedure Name: LMA Insertion Date/Time: 06/08/2022 7:44 AM  Performed by: Genelle Bal, CRNAPre-anesthesia Checklist: Patient identified, Emergency Drugs available, Suction available and Patient being monitored Patient Re-evaluated:Patient Re-evaluated prior to induction Oxygen Delivery Method: Circle system utilized Preoxygenation: Pre-oxygenation with 100% oxygen Induction Type: IV induction Ventilation: Mask ventilation without difficulty LMA: LMA inserted LMA Size: 4.0 Number of attempts: 1 Airway Equipment and Method: Bite block Placement Confirmation: positive ETCO2 Tube secured with: Tape Dental Injury: Teeth and Oropharynx as per pre-operative assessment

## 2022-06-08 NOTE — Op Note (Signed)
Colleen Montgomery 06/08/2022   Pre-op Diagnosis: RIGHT BREAST CANCER     Post-op Diagnosis: same  Procedure(s): RIGHT BREAST RADIOACTIVE SEED GUIDED LUMPECTOMY RIGHT DEEP AXILLARY RADIOACTIVE SEED GUIDED TARGETED LYMPH NODE DISSECTION   Surgeon(s): Coralie Keens, MD [name], MD  Anesthesia: General  Staff:  Circulator: Rometta Emery, RN Relief Circulator: Celene Squibb, RN Relief Scrub: Cranford Mon Scrub Person: Tempie Hoist, RN Surgical/MD Assistant: Clance Boll, PA-C  Estimated Blood Loss: Minimal               Specimens: sent to path  Indications: This is a 56 year old female who was diagnosed with a right breast cancer.  She did undergo a biopsy showing the invasive ductal carcinoma.  She also had enlarged lymph nodes in the axilla.  One was biopsied also showing metastatic disease.  The decision was made to proceed with a radioactive seed guided right breast lumpectomy and a radioactive seed guided targeted right axillary lymph node dissection followed by an oncoplastic reduction  Procedure: The patient was brought to the operating room identifies correct patient.  She was placed upon the operating table general anesthesia was induced.  Her bilateral breast and chest were then prepped and draped in usual sterile fashion.  The palpable mass of the radioactive seed was located at the 11 o'clock position 7 cm from the nipple areolar complex.  I made a longitudinal incision over the top of the palpable mass with a scalpel.  I then dissected down to the breast tissue with electrocautery.  I stayed widely around and underneath the skin anteriorly.  I then dissected medial to the palpable mass and radioactive seed with the cautery going down the chest wall.  I then dissected superiorly and inferiorly Stangel down along the chest wall as well and then took the dissection laterally going past the palpable mass.  I then  completed the lumpectomy staying underneath the mass along the chest wall with the cautery.  I marked all margins with paint.  An x-ray was then performed on the specimen confirming the radioactive seed and previous biopsy clip were in the center of the specimen.  The lumpectomy specimen was then sent to pathology for evaluation.  Through the same incision I was able to identify the radioactive seed in the deep axillary tissue.  I dissected along the pectoralis muscle and then down into the deep axilla with the aid of the cautery.  I could then identify the large firm palpable lymph node containing the radioactive seed.  I was able to elevate this and excised the surrounding fatty tissue and other palpable lymph nodes in the area and one large specimen with electrocautery.  An x-ray was performed on the tissue confirming that the radioactive seed and previous biopsy clip were in the specimen.  The entire specimen was then sent to pathology as the targeted lymph node dissection.  I again palpated the axilla and found no other large palpable lymph nodes.  Hemostasis was achieved in the axilla with surgical clips and a 3-0 Vicryl suture.  I placed surgical clips around the periphery of the lumpectomy cavity for marker purposes.  I irrigated the wound with saline and hemostasis peer to be achieved.  At this point Dr. Erin Hearing presented to the operating room to continue his portion of the procedure.  She remained stable under general anesthesia.          Coralie Keens   Date: 06/08/2022  Time:  8:55 AM

## 2022-06-08 NOTE — Interval H&P Note (Signed)
History and Physical Interval Note:no change in H and P  06/08/2022 7:05 AM  Colleen Montgomery  has presented today for surgery, with the diagnosis of RIGHT BREAST CANCER.  The various methods of treatment have been discussed with the patient and family. After consideration of risks, benefits and other options for treatment, the patient has consented to  Procedure(s): RIGHT BREAST LUMPECTOMY (Right) RADIOACTIVE SEED GUIDED RIGHT AXILLARY SENTINEL LYMPH NODE DISSECTION (Right) BILATERAL ONCOPLASTIC BREAST REDUCTION (Bilateral) as a surgical intervention.  The patient's history has been reviewed, patient examined, no change in status, stable for surgery.  I have reviewed the patient's chart and labs.  Questions were answered to the patient's satisfaction.     Coralie Keens

## 2022-06-08 NOTE — Anesthesia Procedure Notes (Addendum)
  Anesthesia Regional Block: Pectoralis block   Pre-Anesthetic Checklist: , timeout performed,  Correct Patient, Correct Site, Correct Laterality,  Correct Procedure, Correct Position, site marked,  Risks and benefits discussed,  Surgical consent,  Pre-op evaluation,  At surgeon's request and post-op pain management  Laterality: Right  Prep: chloraprep       Needles:  Injection technique: Single-shot  Needle Type: Echogenic Stimulator Needle     Needle Length: 10cm  Needle Gauge: 21   Needle insertion depth: 7 cm   Additional Needles:   Procedures:,,,, ultrasound used (permanent image in chart),,    Narrative:  Start time: 06/08/2022 7:09 AM End time: 06/08/2022 7:14 AM Injection made incrementally with aspirations every 5 mL.  Performed by: Personally  Anesthesiologist: Josephine Igo, MD  Additional Notes: Timeout performed. Patient sedated. Relevant anatomy ID'd using [place]. Incremental 2-39m injection of LA with frequent aspiration. Patient tolerated procedure well.

## 2022-06-08 NOTE — Transfer of Care (Signed)
Immediate Anesthesia Transfer of Care Note  Patient: Colleen Montgomery  Procedure(s) Performed: RIGHT BREAST LUMPECTOMY (Right: Breast) RADIOACTIVE SEED GUIDED RIGHT AXILLARY SENTINEL LYMPH NODE DISSECTION (Right: Breast) BILATERAL ONCOPLASTIC BREAST REDUCTION (Bilateral: Breast)  Patient Location: PACU  Anesthesia Type:MAC combined with regional for post-op pain  Level of Consciousness: awake, alert , and oriented  Airway & Oxygen Therapy: Patient Spontanous Breathing and Patient connected to face mask oxygen  Post-op Assessment: Report given to RN and Post -op Vital signs reviewed and stable  Post vital signs: Reviewed and stable  Last Vitals:  Vitals Value Taken Time  BP 128/80 06/08/22 1139  Temp    Pulse 97 06/08/22 1141  Resp 11 06/08/22 1141  SpO2 100 % 06/08/22 1141  Vitals shown include unvalidated device data.  Last Pain:  Vitals:   06/08/22 0633  TempSrc: Oral  PainSc:          Complications: No notable events documented.

## 2022-06-08 NOTE — Interval H&P Note (Signed)
History and Physical Interval Note:  06/08/2022 7:15 AM  Colleen Montgomery  has presented today for surgery, with the diagnosis of RIGHT BREAST CANCER.  The various methods of treatment have been discussed with the patient and family. After consideration of risks, benefits and other options for treatment, the patient has consented to  Procedure(s): RIGHT BREAST LUMPECTOMY (Right) RADIOACTIVE SEED GUIDED RIGHT AXILLARY SENTINEL LYMPH NODE DISSECTION (Right) BILATERAL ONCOPLASTIC BREAST REDUCTION (Bilateral) as a surgical intervention.  The patient's history has been reviewed, patient examined, no change in status, stable for surgery.  I have reviewed the patient's chart and labs.  Questions were answered to the patient's satisfaction.     [name]

## 2022-06-08 NOTE — Discharge Instructions (Addendum)
INSTRUCTIONS FOR AFTER BREAST SURGERY   You will likely have some questions about what to expect following your operation.  The following information will help you and your family understand what to expect when you are discharged from the hospital.  Following these guidelines will help ensure a smooth recovery and reduce risks of complications.  Postoperative instructions include information on: diet, wound care, medications and physical activity.  AFTER SURGERY Expect to go home after the procedure.  In some cases, you may need to spend one night in the hospital for observation.  DIET Breast surgery does not require a specific diet.  However, the healthier you eat the better your body can start healing. It is important to increasing your protein intake.  This means limiting the foods with sugar and carbohydrates.  Focus on vegetables and some meat.  If you have any liposuction during your procedure be sure to drink water.  If your urine is bright yellow, then it is concentrated, and you need to drink more water.  As a general rule after surgery, you should have 8 ounces of water every hour while awake.  If you find you are persistently nauseated or unable to take in liquids let us know.  NO TOBACCO USE or EXPOSURE.  This will slow your healing process and increase the risk of a wound.  WOUND CARE Leave the binder on for 3 days . Use fragrance free soap.   After 3 days you can remove the binder to shower. Once dry apply binder or sports bra.  Use a mild soap like Dial, Dove and [place]. You may have Topifoam or Lipofoam on.  It is soft and spongy and helps keep you from getting creases if you have liposuction.  This can be removed before the shower and then replaced.  If you need more it is available on [employer] (Lipofoam). If you have steri-strips / tape directly attached to your skin leave them in place. It is OK to get these wet.   No baths, pools or hot tubs for four weeks. We close your incision to  leave the smallest and best-looking scar. No ointment or creams on your incisions until given the go ahead.  Especially not Neosporin (Too many skin reactions with this one).  A few weeks after surgery you can use Mederma and start massaging the scar. We ask you to wear your binder or sports bra for the first 6 weeks around the clock, including while sleeping. This provides added comfort and helps reduce the fluid accumulation at the surgery site.  ACTIVITY No heavy lifting until cleared by the doctor.  This usually means no more than a half-gallon of milk.  It is OK to walk and climb stairs. In fact, moving your legs is very important to decrease your risk of a blood clot.  It will also help keep you from getting deconditioned.  Every 1 to 2 hours get up and walk for 5 minutes. This will help with a quicker recovery back to normal.  Let pain be your guide so you don't do too much.  This is not the time for spring cleaning and don't plan on taking care of anyone else.  This time is for you to recover,  You will be more comfortable if you sleep and rest with your head elevated either with a few pillows under you or in a recliner.  No stomach sleeping for a three months.  WORK Everyone returns to work at different times. As a  rough guide, most people take at least 1 - 2 weeks off prior to returning to work. If you need documentation for your job, bring the forms to your postoperative follow up visit.  DRIVING Arrange for someone to bring you home from the hospital.  You may be able to drive a few days after surgery but not while taking any narcotics or valium.  BOWEL MOVEMENTS Constipation can occur after anesthesia and while taking pain medication.  It is important to stay ahead for your comfort.  We recommend taking Milk of Magnesia (2 tablespoons; twice a day) while taking the pain pills.  MEDICATIONS You may be prescribed should start after surgery At your preoperative visit for you history and  physical you may have been given the following medications: Zofran 4 mg:  This is to treat nausea and vomiting.  You can take this every 6 hours as needed and only if needed. Oxycodone '5mg'$ :  This is only to be used after you have taken the motrin or the tylenol. Every 8 hours as needed.   Over the counter Medication to take: Ibuprofen (Motrin) 600 mg:  Take this every 6 hours.  If you have additional pain then take 500 mg of the tylenol every 8 hours.  Only take the Norco after you have tried these two. Miralax or stool softener of choice: Take this according to the bottle if you take the [place] Call your surgeon's office if any of the following occur: Fever 101 degrees F or greater Excessive bleeding or fluid from the incision site. Pain that increases over time without aid from the medications Redness, warmth, or pus draining from incision sites Persistent nausea or inability to take in liquids Severe misshapen area that underwent the operation.  Here are some resources:  Plastic surgery website: https://www.plasticsurgery.org/for-medical-professionals/education-and-resources/publications/breast-reconstruction-magazine Breast Reconstruction Awareness Campaign:  [employer] Plastic surgery Implant information:  https://www.plasticsurgery.org/patient-safety/breast-implant-safety

## 2022-06-08 NOTE — Anesthesia Postprocedure Evaluation (Signed)
Anesthesia Post Note  Patient: Colleen Montgomery  Procedure(s) Performed: RIGHT BREAST LUMPECTOMY (Right: Breast) RADIOACTIVE SEED GUIDED RIGHT AXILLARY SENTINEL LYMPH NODE DISSECTION (Right: Breast) BILATERAL ONCOPLASTIC BREAST REDUCTION (Bilateral: Breast)     Patient location during evaluation: PACU Anesthesia Type: General Level of consciousness: awake and alert and oriented Pain management: pain level controlled Vital Signs Assessment: post-procedure vital signs reviewed and stable Respiratory status: spontaneous breathing, nonlabored ventilation and respiratory function stable Cardiovascular status: blood pressure returned to baseline and stable Postop Assessment: no apparent nausea or vomiting Anesthetic complications: no   No notable events documented.  Last Vitals:  Vitals:   06/08/22 1230 06/08/22 1245  BP: 120/68 121/66  Pulse: 90 67  Resp: (!) 25 12  Temp:    SpO2: 98% 95%    Last Pain:  Vitals:   06/08/22 1245  TempSrc:   PainSc: 4                  Gardy Montanari A.

## 2022-06-09 ENCOUNTER — Encounter (HOSPITAL_COMMUNITY): Payer: Self-pay | Admitting: Surgery

## 2022-06-09 ENCOUNTER — Ambulatory Visit: Admitting: Hematology and Oncology

## 2022-06-09 ENCOUNTER — Other Ambulatory Visit

## 2022-06-09 ENCOUNTER — Ambulatory Visit

## 2022-06-09 NOTE — Addendum Note (Signed)
Addendum  created 06/09/22 1504 by Josephine Igo, MD   Clinical Note Signed, Intraprocedure Blocks edited

## 2022-06-09 NOTE — Op Note (Signed)
Operative Note   DATE OF OPERATION: 06/08/22  LOCATION: [place]   SURGICAL DEPARTMENT: Plastic Surgery  PREOPERATIVE DIAGNOSES: Bilateral symptomatic macromastia with breast cancer.  POSTOPERATIVE DIAGNOSES:  same  PROCEDURE: Bilateral oncoplastic breast reduction with superomedial pedicle.  SURGEON: [name], MD  ASSISTANT: [facility]  ANESTHESIA: General.  COMPLICATIONS: None.   INDICATIONS FOR PROCEDURE:  The patient, Colleen Montgomery is a 56 y.o. female born on [birth year], is here for treatment of bilateral symptomatic macromastia. MRN: [id number]  CONSENT:  Informed consent was obtained directly from the patient. Risks, benefits and alternatives were fully discussed. Specific risks including but not limited to bleeding, infection, hematoma, seroma, scarring, pain, infection, contracture, asymmetry, wound healing problems, and need for further surgery were all discussed. The patient did have an ample opportunity to have questions answered to satisfaction.   DESCRIPTION OF PROCEDURE:  The patient was marked preoperatively for a Wise pattern skin excision.  The patient was taken to the operating room. SCDs were placed and antibiotics were given. General anesthesia was administered.The patient's operative site was prepped and draped in a sterile fashion. A time out was performed and all information was confirmed to be correct.  Right Breast: The breast was infiltrated with tumescent solution to help with hemostasis.  The nipple was marked with a cookie cutter.  A superomedial pedicle was drawn out with the base of at least 8 cm in size.  A breast tourniquet was then applied and the pedicle was de-epithelialized.  Breast tourniquet was then let down and all incisions were made with a 10 blade.  The pedicle was then isolated down to the chest wall with cautery and the excision was performed removing tissue primarily inferiorly and laterally.  Hemostasis  was obtained and the wound was stapled closed.  Left breast:  The breast was infiltrated with tumescent solution to help with hemostasis.  The nipple was marked with a cookie cutter.  A superomedial pedicle was drawn out with the base of at least 8 cm in size.  A breast tourniquet was then applied and the pedicle was de-epithelialized.  Breast tourniquet was then let down and all incisions were made with a 10 blade.  The pedicle was then isolated down to the chest wall with cautery and the excision was performed removing tissue primarily inferiorly and laterally.  Hemostasis was obtained and the wound was stapled closed.  Patient was then set up to check for size and symmetry.  Minor modifications were made.  This resulted in a total of 462 g removed from the right side and 479 g removed from the left side.  The inframammary incision was closed with a combination of buried in-sorb staples, 3-0 PDS and a running V-lock 90.  The vertical and periareolar limbs were closed with interrupted buried 4-0 PDS and a running V-lock 90 suture.  Steri-Strips were then applied along with a soft dressing and breast binder.  The lumpectomy incision was closed in a layered fashioned with PDS and V-lock 90.  The patient tolerated the procedure well.  There were no complications. The patient was allowed to wake from anesthesia, extubated and taken to the recovery room in satisfactory condition.  I was present for the entire procedure.

## 2022-06-12 ENCOUNTER — Other Ambulatory Visit

## 2022-06-12 ENCOUNTER — Telehealth: Payer: Self-pay | Admitting: Student

## 2022-06-12 ENCOUNTER — Ambulatory Visit: Admitting: Hematology and Oncology

## 2022-06-12 ENCOUNTER — Ambulatory Visit

## 2022-06-12 NOTE — Telephone Encounter (Signed)
I attempted to call the patient but she didn't answer. LVM to return call.

## 2022-06-12 NOTE — Telephone Encounter (Signed)
Pt is calling in stating that she would like to have a call back from [place], [place] would not elaborate on the purpose of the call stated it was personal.

## 2022-06-12 NOTE — Telephone Encounter (Signed)
Patient called having questions about some of her dressings.  She states that the Mepilex border dressing is kind of tight and that water got underneath it.  I told the patient to take the Mepilex border dressings off.  I told her that she can apply gauze daily to the incision.  Patient expressed understanding.  Patient also states that she has a little bit of tenderness and bruising to the lateral aspects of her breast.  I discussed with the patient that this is normal given that she is a few days out.  Patient to continue to monitor symptoms.

## 2022-06-15 ENCOUNTER — Encounter: Payer: Self-pay | Admitting: Student

## 2022-06-15 ENCOUNTER — Ambulatory Visit (INDEPENDENT_AMBULATORY_CARE_PROVIDER_SITE_OTHER): Admitting: Student

## 2022-06-15 ENCOUNTER — Encounter: Payer: Self-pay | Admitting: *Deleted

## 2022-06-15 ENCOUNTER — Ambulatory Visit: Admitting: Physical Therapy

## 2022-06-15 DIAGNOSIS — C50411 Malignant neoplasm of upper-outer quadrant of right female breast: Secondary | ICD-10-CM

## 2022-06-15 NOTE — Progress Notes (Signed)
Patient is a 56 year old female who underwent right breast lumpectomy and right deep axillary lymph node dissection with Dr. [name] followed by bilateral oncoplastic breast reduction with Dr. Erin Hearing on 06/08/2022.  Intraoperatively, patient had 462 g removed from the right side and 479 g removed from the left side.  Patient presents to the clinic today for postoperative follow-up.  Today, patient reports she is doing well.  She states that she feels like she is having some nerve pain here and there, but does not have any significant pain.  She denies any fevers or chills.  She denies any nausea or vomiting.  Chaperone present on exam.  On exam, patient is sitting upright in no acute distress.  Breasts are soft and symmetric bilaterally.  There is some overlying mild ecchymosis to each breast.  Lateral lumpectomy incision site to the right breast is intact with Steri-Strips.  NAC's are viable bilaterally.  There appears to be a spot of irritation near the suture to the medial aspect of the right NAC.  There is no drainage from this area.  There is also a small spot to the superior aspect of the vertical limb incision that has some skin necrosis.  There is no drainage from this area.  Mepilex border dressings were removed.  Steri-Strips were removed from all of the incisions except the right lateral lumpectomy incision.  Incisions appear intact bilaterally with the exception of the areas mentioned above.  There are no significant fluid collections palpated on exam.  I discussed with the patient that she can put a small amount of Vaseline over the superior aspect of the vertical limb incision.  I discussed with the patient that she can cover the incisions with gauze and change daily if she wants.  I discussed with the patient that she can transition into a compressive sports bra from the binder.  I discussed with the patient to avoid strenuous activities.  Patient expressed understanding.  Patient to  follow-up next week.  Instructed the patient to call in the meantime if she has any questions or concerns.  Pictures were obtained of the patient and placed in the chart with the patient's or guardian's permission.

## 2022-06-15 NOTE — Progress Notes (Signed)
Patient Care Team: [name], MD as PCP - General (Family Medicine) Mauro Kaufmann, RN as Oncology Nurse Navigator Rockwell Germany, RN as Oncology Nurse Navigator Coralie Keens, MD as Consulting Physician (General Surgery) [name], MD as Consulting Physician (Hematology and Oncology) Gery Pray, MD as Consulting Physician (Radiation Oncology)  DIAGNOSIS:  Encounter Diagnosis  Name Primary?   Malignant neoplasm of upper-outer quadrant of right breast in female, estrogen receptor positive ([place]) Yes    SUMMARY OF ONCOLOGIC HISTORY: Oncology History  Malignant neoplasm of upper-outer quadrant of right breast in female, estrogen receptor positive ([place])  04/03/2022 Initial Diagnosis   Palpable right breast mass UOQ, by ultrasound at 11 o'clock position 1.9 cm mass which on biopsy was grade 3 IDC ER 60%, PR 0%, Ki-67 60%, HER2 negative, 1 lymph node in the axilla: Biopsy positive   04/15/2022 Cancer Staging   Staging form: Breast, AJCC 8th Edition - Clinical: Stage IIB (cT1c, cN1, cM0, G3, ER+, PR-, HER2-) - Signed by [name], MD on 04/15/2022 Stage prefix: Initial diagnosis Histologic grading system: 3 grade system    Genetic Testing   [facility] CancerNext-Expanded Panel was Negative. 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 Report date is 04/24/2022.  The CancerNext-Expanded gene panel offered by [facility] and includes sequencing, rearrangement, and RNA analysis for the following 77 genes: AIP, ALK, APC, ATM, AXIN2, BAP1, BARD1, BLM, BMPR1A, BRCA1, BRCA2, BRIP1, CDC73, CDH1, CDK4, CDKN1B, CDKN2A, CHEK2, CTNNA1, DICER1, FANCC, FH, FLCN, GALNT12, KIF1B, LZTR1, MAX, MEN1, MET, MLH1, MSH2, MSH3, MSH6, MUTYH, NBN, NF1, NF2, NTHL1, PALB2, PHOX2B, PMS2, POT1, PRKAR1A, PTCH1, PTEN, RAD51C, RAD51D, RB1, RECQL, RET, SDHA, SDHAF2, SDHB, SDHC, SDHD, SMAD4, SMARCA4, SMARCB1, SMARCE1, STK11, SUFU, TMEM127, TP53, TSC1, TSC2, VHL and  XRCC2 (sequencing and deletion/duplication); EGFR, EGLN1, HOXB13, KIT, MITF, PDGFRA, POLD1, and POLE (sequencing only); EPCAM and GREM1 (deletion/duplication only).    06/02/2022 - 06/02/2022 Chemotherapy   Patient is on Treatment Plan : BREAST Pembrolizumab (200) D1 + Carboplatin (1.5) D1,8,15 + Paclitaxel (80) D1,8,15 q21d X 4 cycles / Pembrolizumab (200) D1 + AC D1 q21d x 4 cycles       CHIEF COMPLIANT: Follow-up after surgery  INTERVAL HISTORY: Colleen Montgomery is a 56 y.o. with above-mentioned. She presents to the clinic for a follow-up to discuss pathology and surveillance.  She is here to discuss the adjuvant treatment plan.  She is healing and recovering very well from recent surgery.   ALLERGIES:  has No Known Allergies.  MEDICATIONS:  Current Outpatient Medications  Medication Sig Dispense Refill   carboxymethylcellulose (REFRESH TEARS) 0.5 % SOLN Place 1 drop into both eyes daily.     NONFORMULARY OR COMPOUNDED ITEM Take 1 capsule by mouth daily. [facility] at [facility]  Lioehyronine sodium (T3 ) 12 mcg Thyroxine/L (T4) 40 mcg     Vitamin D-Vitamin K (K2 PLUS D3 PO) Take 1 tablet by mouth daily.     No current facility-administered medications for this visit.    PHYSICAL EXAMINATION: ECOG PERFORMANCE STATUS: 1 - Symptomatic but completely ambulatory  Vitals:   06/22/22 1407  BP: 124/73  Pulse: (!) 108  Resp: 18  Temp: 97.7 F (36.5 C)  SpO2: 98%   Filed Weights   06/22/22 1407  Weight: 167 lb (75.8 kg)      LABORATORY DATA:  I have reviewed the data as listed    Latest Ref Rng & Units 04/15/2022   12:33 PM 05/27/2019  5:06 AM  CMP  Glucose 70 - 99 mg/dL 83  102   BUN 6 - 20 mg/dL 12  21   Creatinine 0.44 - 1.00 mg/dL 0.92  1.12   Sodium 135 - 145 mmol/L 140  140   Potassium 3.5 - 5.1 mmol/L 3.9  3.6   Chloride 98 - 111 mmol/L 106  106   CO2 22 - 32 mmol/L 28  25   Calcium 8.9 - 10.3 mg/dL 9.9  9.1   Total Protein 6.5 - 8.1 g/dL 7.1  7.4    Total Bilirubin 0.3 - 1.2 mg/dL 0.5  0.6   Alkaline Phos 38 - 126 U/L 61  69   AST 15 - 41 U/L 18  19   ALT 0 - 44 U/L 16  19     Lab Results  Component Value Date   WBC 5.5 06/05/2022   HGB 12.4 06/05/2022   HCT 37.6 06/05/2022   MCV 87.4 06/05/2022   PLT 213 06/05/2022   NEUTROABS 2.9 04/15/2022    ASSESSMENT & PLAN:  Malignant neoplasm of upper-outer quadrant of right breast in female, estrogen receptor positive ([place]) 04/03/2022:Palpable right breast mass UOQ, by ultrasound at 11 o'clock position 1.9 cm mass which on biopsy was grade 3 IDC ER 60%, PR 0%, Ki-67 60%, HER2 negative, 1 lymph node in the axilla: Biopsy positive.   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 Given the positive lymph node and the high risk Oncotype, I recommended doing neoadjuvant chemotherapy.   Treatment plan: 1.  Neoadjuvant chemotherapy with Taxol Doristine Church followed by Adriamycin Cytoxan Keytruda followed by Beryle Flock maintenance for 1 year. 2. breast conserving surgery with targeted node dissection 3.  Adjuvant radiation 4. +/- Antiestrogens (based upon final pathology) -------------------------------------------------------------------------------------------------------- Patient wanted to pursue holistic treatment options.  Right lumpectomy 06/08/2022: Grade 3 IDC 5.5 cm, 1/2 lymph nodes positive focal extracapsular extension is present ER 60% weak to moderate, PR 0%, HER2 negative, Ki-67 60%  Pathology counseling: I discussed the final pathology report of the patient provided  a copy of this report. I discussed the margins as well as lymph node surgeries. We also discussed the final staging along with previously performed ER/PR and HER-2/neu testing.  She is following with holistic doctors who have performed [employer] analysis on circulating tumor cells for drug  sensitivity.  Treatment plan: Patient is not interested in adjuvant chemotherapy. Adjuvant radiation Adjuvant antiestrogen therapy if the final prognostic panel still shows estrogen receptor positivity.  I requested that the final pathology be sent for breast prognostic panel.  Return to clinic after radiation is complete.    No orders of the defined types were placed in this encounter.  The patient has a good understanding of the overall plan. she agrees with it. she will call with any problems that may develop before the next visit here. Total time spent: 30 mins including face to face time and time spent for planning, charting and co-ordination of care   Harriette Ohara, MD 06/22/22    I Gardiner Coins am scribing for Dr. [name]  I have reviewed the above documentation for accuracy and completeness, and I agree with the above.

## 2022-06-17 LAB — SURGICAL PATHOLOGY

## 2022-06-18 ENCOUNTER — Encounter: Payer: Self-pay | Admitting: *Deleted

## 2022-06-22 ENCOUNTER — Encounter: Payer: Self-pay | Admitting: *Deleted

## 2022-06-22 ENCOUNTER — Other Ambulatory Visit: Payer: Self-pay

## 2022-06-22 ENCOUNTER — Inpatient Hospital Stay: Attending: Hematology and Oncology | Admitting: Hematology and Oncology

## 2022-06-22 VITALS — BP 124/73 | HR 108 | Temp 97.7°F | Resp 18 | Ht 66.0 in | Wt 167.0 lb

## 2022-06-22 DIAGNOSIS — C50411 Malignant neoplasm of upper-outer quadrant of right female breast: Secondary | ICD-10-CM | POA: Diagnosis not present

## 2022-06-22 DIAGNOSIS — Z170 Estrogen receptor positive status [ER+]: Secondary | ICD-10-CM | POA: Insufficient documentation

## 2022-06-22 NOTE — Assessment & Plan Note (Deleted)
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 Given the positive lymph node and the high risk Oncotype, I recommended doing neoadjuvant chemotherapy.   Treatment plan: 1.  Neoadjuvant chemotherapy with Taxol Doristine Church followed by Adriamycin Cytoxan Keytruda followed by Beryle Flock maintenance for 1 year. 2. breast conserving surgery with targeted node dissection 3.  Adjuvant radiation 4. +/- Antiestrogens (based upon final pathology) -------------------------------------------------------------------------------------------------------- Patient wanted to pursue holistic treatment options.  Right lumpectomy 06/08/2022: Grade 3 IDC 5.5 cm, 1/2 lymph nodes positive focal extracapsular extension is present ER 60% weak to moderate, PR 0%, HER2 negative, Ki-67 60%  Pathology counseling: I discussed the final pathology report of the patient provided  a copy of this report. I discussed the margins as well as lymph node surgeries. We also discussed the final staging along with previously performed ER/PR and HER-2/neu testing.  We once again discussed the pros and cons of doing chemotherapy. I also recommended radiation followed by antiestrogen therapy.

## 2022-06-23 ENCOUNTER — Encounter: Payer: Self-pay | Admitting: Hematology and Oncology

## 2022-06-23 ENCOUNTER — Telehealth: Payer: Self-pay | Admitting: Radiation Oncology

## 2022-06-23 NOTE — Telephone Encounter (Signed)
11/7 @ 11:07 am Left voicemail for patient to call our office to be schedule for consult with Dr. Sondra Come.

## 2022-06-24 ENCOUNTER — Telehealth: Payer: Self-pay | Admitting: Radiation Oncology

## 2022-06-24 NOTE — Telephone Encounter (Signed)
11/8 @ 9:11 am Left voicemail for patient to call our office to be schedule for consult.

## 2022-06-24 NOTE — Telephone Encounter (Signed)
11/8 @ 11:50 am patient return call/left voicemail.  Patient request Friday morning for consult.  Left voicemail for patient to call our office, Dr. Sondra Come off on Fridays. Will Monday - Thursday in morning work for her/waiting on call back.

## 2022-06-25 ENCOUNTER — Ambulatory Visit (INDEPENDENT_AMBULATORY_CARE_PROVIDER_SITE_OTHER): Admitting: Student

## 2022-06-25 DIAGNOSIS — C50411 Malignant neoplasm of upper-outer quadrant of right female breast: Secondary | ICD-10-CM

## 2022-06-25 MED ORDER — CEPHALEXIN 500 MG PO CAPS: 500.0000 mg | ORAL_CAPSULE | Freq: Four times a day (QID) | ORAL | 0 refills | Status: AC

## 2022-06-25 NOTE — Progress Notes (Signed)
Patient is a 56 year old female who underwent right breast lumpectomy and right deep axillary lymph node dissection with Dr. [name] followed by bilateral oncoplastic breast reduction with Dr. Erin Hearing on 06/08/2022.  Patient presents to the clinic today for postoperative follow-up.     Patient was last seen in the clinic on 06/15/2022.  At this visit, she was doing well.  On exam, there is some overlying mild ecchymosis to each breast.  NAC's were viable bilaterally.  There is some spots of irritation near the suture to the medial aspect of the right NAC.  There is a small spot to the superior aspect of the vertical limb that looked like it had some superficial skin necrosis/irritation.  Incisions are otherwise intact and healing well.  Plan is for patient to apply Vaseline daily to the areas of irritation and to follow-up in 1 week.  Today, patient reports that she is doing well.  She states that the right lateral incision where the lumpectomy is a little irritating to her.  She also reports that the sports bra has been irritating her as well.  She states that she has been applying Vaseline daily to her wounds.  She denies any fevers or chills.  Chaperone present on exam.  On exam, patient is sitting upright in no acute distress.  Breasts are soft and fairly symmetric bilaterally.  There is a little residual ecchymosis noted overlying the breasts, but is slightly improved from previous exam.  There are no significant subcutaneous fluid collections palpated on exam.  NAC's are viable bilaterally.    To the right breast, the Steri-Strips to the lumpectomy incision appears to be disrupted.  The Steri-Strips were removed.  The incision is intact and healing well.  There was a V-Loc suture that was sticking out.  This was trimmed.  There is no surrounding erythema or drainage.  Incisions to the right breast are intact.  There is a little bit of irritation noted to the inferior aspect of the right NAC and to  the medial aspect of the inframammary incision.  There is no surrounding erythema or drainage.  To the left breast, there is a small superficial wound noted to the medial aspect of the NAC.  The area to the superior aspect of the vertical limb incision that looked like it could possibly be some superficial skin necrosis/irritation last week appears to be more irritation and superficial skin sloughing rather than necrosis.  There is no drainage noted to this area.  To the inferior T-zone, there is some blanchable erythema noted.  It appears to be irritation versus early cellulitis.  There is no drainage noted.  There is some irritation noted to the medial aspect of the inframammary incision.  Incision is otherwise intact.  I discussed with the patient that the area of redness appears to be irritation versus cellulitis.  I discussed with her that we will start her on antibiotics to cover her in the case that it is cellulitis.  5 days of Keflex will be sent.  Patient was in agreement with the plan.  I discussed with the patient that she should keep an eye on the area of redness.  I discussed with her that if it worsens, becomes more painful, if she develops drainage, if she develops fevers or chills, or any worsening symptoms, she should let us know.  Patient expressed understanding.  I discussed with the patient that she should continue to apply Vaseline to the irritation areas noted to the right NAC,  the medial aspect of the right inframammary incision, the areas noted to the left NAC, the superior aspect of the left vertical limb, and the medial aspect of the inframammary incision.  I discussed with her she could put a little Vaseline over the lumpectomy incision as well.  I discussed with the patient that she may apply gauze over her incisions daily if she would like for padding, but she does not have to.  I discussed with the patient that she may switch to the binder if she felt that was more comfortable  and less irritating.  Patient to follow-up next week.  I instructed her to call in the meantime if she has any questions or concerns.  Pictures were obtained of the patient and placed in the chart with the patient's or guardian's permission.

## 2022-06-29 ENCOUNTER — Ambulatory Visit: Admitting: Rehabilitation

## 2022-06-29 ENCOUNTER — Encounter: Admitting: Student

## 2022-06-29 ENCOUNTER — Telehealth: Payer: Self-pay | Admitting: Student

## 2022-06-29 NOTE — Telephone Encounter (Signed)
Would like a call back regarding taking her Keflex and having issues.  Wanted to discuss concerns.

## 2022-06-29 NOTE — Telephone Encounter (Signed)
Patient called the office with concerns for a rash she developed.  She states that she started taking Keflex and developed a rash near the surgical site and also her thighs and ankles.  She denies any symptoms of difficulty breathing, throat closing or drooling.  On the phone, she is speaking in full clear sentences and there is no distress from her speech.  Patient states she stopped taking the Keflex 2 days ago.  She reports her rash has improved a little bit.  She states she has been taking Benadryl.  I offered the patient to come into the clinic to be evaluated.  She states that she is out of town and will not be able to come in until her next appointment.  I discussed with the patient to continue to take Benadryl and to continue to monitor the rash.  I discussed with the patient she can apply hydrocortisone cream to the rash if she would like.  I discussed with the patient that if her rash worsens, if she experiences any shortness of breath, difficulty breathing, or sensation of her throat closing she will need to go to the emergency room.  Patient expressed understanding.

## 2022-07-01 ENCOUNTER — Ambulatory Visit: Attending: Surgery | Admitting: Rehabilitation

## 2022-07-01 ENCOUNTER — Encounter: Payer: Self-pay | Admitting: Rehabilitation

## 2022-07-01 DIAGNOSIS — R293 Abnormal posture: Secondary | ICD-10-CM | POA: Diagnosis present

## 2022-07-01 DIAGNOSIS — M25611 Stiffness of right shoulder, not elsewhere classified: Secondary | ICD-10-CM | POA: Diagnosis present

## 2022-07-01 DIAGNOSIS — Z483 Aftercare following surgery for neoplasm: Secondary | ICD-10-CM | POA: Diagnosis present

## 2022-07-01 DIAGNOSIS — C50411 Malignant neoplasm of upper-outer quadrant of right female breast: Secondary | ICD-10-CM | POA: Diagnosis not present

## 2022-07-01 DIAGNOSIS — Z170 Estrogen receptor positive status [ER+]: Secondary | ICD-10-CM | POA: Diagnosis present

## 2022-07-01 NOTE — Progress Notes (Signed)
Radiation Oncology         [phone]) [phone] ________________________________  Name: Colleen Montgomery MRN: [id number]  Date: 07/02/2022  DOB: 04-Aug-1966  Re-Evaluation Note  CC: Colleen Pepper, MD  Colleen Lose, MD  No diagnosis found.  Diagnosis:   Stage IIB (cT1c, cN1, cM0) Right Breast UOQ, Invasive Ductal Carcinoma, ER- / PR- / Her2-, Grade 3, s/p lumpectomy with clean margins and 1 positive axillary LN. She has declined chemotherapy (pursuing holistic treatment)   Narrative:  The patient returns today to discuss radiation treatment options. She was seen in the multidisciplinary breast clinic/consultation on 04/15/22.   On the date of her consultation, she opted to pursue genetic testing. Results showed no clinically significant variants detected by BRCAplus  or+RNAinsight testing. 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   Oncotype DX was obtained from her initial biopsy sample and the recurrence score of 51 predicted a risk of recurrence outside the breast over the next 9 years of >42%, if the patient's only systemic therapy is an antiestrogen for 5 years.  It also predicted a significant benefit from chemotherapy.  The patient accordingly followed up with Dr. Lindi Montgomery to discuss systemic treatment options. However, the patient ultimately opted against chemotherapy and decided to proceed with surgery and holistic treatment in some form. Her holistic doctor performed onconomics analysis on circulating tumor cells to determine drug sensitivity (report located in media tab dated 06/23/22).   She opted to proceed with right breast lumpectomy and nodal biopsies on 06/08/22 under the care of Dr. Ninfa Montgomery. Pathology from the procedure revealed grade 3 invasive ductal carcinoma measuring 5.5 cm. All margins negative for carcinoma. Nodal status of 1/2 right axillary lymph node excisions positive for metastatic carcinoma with focal extranodal  extension (measuring 1.4 cm in the greatest dimension). Prognostic indicators significant for: estrogen receptor negative; progesterone receptor negative; Proliferation marker Ki67 at 50%; Her2 status negative; Grade 3.   She also underwent bilateral breast reduction at the time of breast conserving surgery.    During her most recent follow-up visit with Dr. Lindi Montgomery on 06/22/22, the patient was noted to he healing and recovering well from surgery. Given that prognostic indicators from her final path confirm triple negative disease, the patient will likely not undergo antiestrogen therapy following XRT.  Even though she is not pursuing chemotherapy, the patient will return to Dr. Lindi Montgomery following radiation for routine follow-up.   On review of systems, the patient reports ***. She denies *** and any other symptoms.    Allergies:  has No Known Allergies.  Meds: Current Outpatient Medications  Medication Sig Dispense Refill   carboxymethylcellulose (REFRESH TEARS) 0.5 % SOLN Place 1 drop into both eyes daily.     NONFORMULARY OR COMPOUNDED ITEM Take 1 capsule by mouth daily. [facility]  Lioehyronine sodium (T3 ) 12 mcg Thyroxine/L (T4) 40 mcg     Vitamin D-Vitamin K (K2 PLUS D3 PO) Take 1 tablet by mouth daily.     No current facility-administered medications for this encounter.    Physical Findings: The patient is in no acute distress. Patient is alert and oriented.  vitals were not taken for this visit.  No significant changes. Lungs are clear to auscultation bilaterally. Heart has regular rate and rhythm. No palpable cervical, supraclavicular, or axillary adenopathy. Abdomen soft, non-tender, normal bowel sounds. Left Breast: no palpable mass, nipple discharge or bleeding. Right Breast: ***  Lab Findings: Lab Results  Component Value Date   WBC 5.5 06/05/2022   HGB 12.4 06/05/2022   HCT 37.6 06/05/2022   MCV 87.4 06/05/2022   PLT 213 06/05/2022     Radiographic Findings: MM BREAST SURGICAL SPECIMEN  Result Date: 06/08/2022 CLINICAL DATA:  Specimen radiograph status post targeted right axillary lymph node dissection. EXAM: SPECIMEN RADIOGRAPH OF THE RIGHT AXILLA COMPARISON:  Previous exam(s). FINDINGS: Status post excision of the right axilla. The radioactive seed and biopsy marker clip are present and completely intact within the specimen. This was communicated to the OR at 8:52 a.m. IMPRESSION: Specimen radiograph of the right breast. Electronically Signed   By: Colleen Montgomery M.D.   On: 06/08/2022 08:52  MM Breast Surgical Specimen  Result Date: 06/08/2022 CLINICAL DATA:  Specimen radiograph status post right breast lumpectomy. EXAM: SPECIMEN RADIOGRAPH OF THE RIGHT BREAST COMPARISON:  Previous exam(s). FINDINGS: Status post excision of the right breast. The radioactive seed and biopsy marker clip are present, completely intact, and were marked for pathology. The contrast of the image is poor, and half of the specimen is not included within the field of view. This was communicated to the nurse in the OR at 8:25 a.m. IMPRESSION: Specimen radiograph of the right breast. Electronically Signed   By: Colleen Montgomery M.D.   On: 06/08/2022 08:26  MM CLIP PLACEMENT RIGHT  Result Date: 06/05/2022 CLINICAL DATA:  Evaluate radioactive seeds EXAM: 3D DIAGNOSTIC RIGHT MAMMOGRAM POST ULTRASOUND GUIDED RADIOACTIVE SEED PLACEMENT COMPARISON:  Previous exam(s). FINDINGS: 3D Mammographic images were obtained following ultrasound guided radioactive seed placement. There is a radioactive seed within the known malignancy in the upper outer right breast. There is a radioactive seed within the known abnormal right axillary lymph node. IMPRESSION: There is a radioactive seed within the known malignancy in the upper outer right breast. There is a radioactive seed within the known abnormal right axillary lymph node. Final Assessment: Post Procedure Mammograms  for Marker Placement Electronically Signed   By: Colleen Montgomery M.D.   On: 06/05/2022 15:19  [place] RT RADIOACTIVE SEED LOC  Result Date: 06/05/2022 CLINICAL DATA:  Localization of a right breast mass and a right axillary lymph node. EXAM: ULTRASOUND GUIDED RADIOACTIVE SEED LOCALIZATION OF THE RIGHT BREAST COMPARISON:  Previous exam(s). FINDINGS: Patient presents for radioactive seed localization prior to surgery. I met with the patient and we discussed the procedure of seed localization including benefits and alternatives. We discussed the high likelihood of a successful procedure. We discussed the risks of the procedure including infection, bleeding, tissue injury and further surgery. We discussed the low dose of radioactivity involved in the procedure. Informed, written consent was given. The usual time-out protocol was performed immediately prior to the procedure. Using ultrasound guidance, sterile technique, 1% lidocaine and an I-125 radioactive seed, right breast mass was localized using a lateral approach. The follow-up mammogram images confirm the seed in the expected location and were marked for the surgeon. Follow-up survey of the patient confirms presence of the radioactive seed. Order number of I-125 seed:  [id number]. Total activity:  6.568 millicuries reference Date: February 23, 2022 Using ultrasound guidance, sterile technique, 1% lidocaine and an I-125 radioactive seed, the right axillary lymph node was localized using a lateral approach. The follow-up mammogram images confirm the seed in the expected location and were marked for the surgeon. Follow-up survey of the patient confirms presence of the radioactive seed. Order number of I-125 seed:  [id number]. Total activity:  7.494 millicuries reference Date: February 23, 2022  The patient tolerated the procedure well and was [facility]. She was given instructions regarding seed removal. IMPRESSION: Radioactive seed localization right  breast and right axilla. No apparent complications. Electronically Signed   By: Colleen Montgomery M.D.   On: 06/05/2022 14:16  [place] RT RADIOACTIVE SEED EA ADD LESION  Result Date: 06/05/2022 CLINICAL DATA:  Localization of a right breast mass and a right axillary lymph node. EXAM: ULTRASOUND GUIDED RADIOACTIVE SEED LOCALIZATION OF THE RIGHT BREAST COMPARISON:  Previous exam(s). FINDINGS: Patient presents for radioactive seed localization prior to surgery. I met with the patient and we discussed the procedure of seed localization including benefits and alternatives. We discussed the high likelihood of a successful procedure. We discussed the risks of the procedure including infection, bleeding, tissue injury and further surgery. We discussed the low dose of radioactivity involved in the procedure. Informed, written consent was given. The usual time-out protocol was performed immediately prior to the procedure. Using ultrasound guidance, sterile technique, 1% lidocaine and an I-125 radioactive seed, right breast mass was localized using a lateral approach. The follow-up mammogram images confirm the seed in the expected location and were marked for the surgeon. Follow-up survey of the patient confirms presence of the radioactive seed. Order number of I-125 seed:  [id number]. Total activity:  7.262 millicuries reference Date: February 23, 2022 Using ultrasound guidance, sterile technique, 1% lidocaine and an I-125 radioactive seed, the right axillary lymph node was localized using a lateral approach. The follow-up mammogram images confirm the seed in the expected location and were marked for the surgeon. Follow-up survey of the patient confirms presence of the radioactive seed. Order number of I-125 seed:  [id number]. Total activity:  3.845 millicuries reference Date: February 23, 2022 The patient tolerated the procedure well and was released from the [facility]. She was given instructions regarding seed removal.  IMPRESSION: Radioactive seed localization right breast and right axilla. No apparent complications. Electronically Signed   By: Colleen Montgomery M.D.   On: 06/05/2022 14:16   Impression:   Stage IIB (cT1c, cN1, cM0) Right Breast UOQ, Invasive Ductal Carcinoma, ER- / PR- / Her2-, Grade 3, s/p lumpectomy with clean margins and 1 positive axillary LN. She has declined chemotherapy (pursuing holistic treatment)   ***  Plan:  Patient is scheduled for CT simulation {date/later today}. ***  -----------------------------------  Blair Promise, PhD, MD  This document serves as a record of services personally performed by Gery Pray, MD. It was created on his behalf by Roney Mans, a trained medical scribe. The creation of this record is based on the scribe's personal observations and the provider's statements to them. This document has been checked and approved by the attending provider.

## 2022-07-01 NOTE — Progress Notes (Signed)
Location of Breast Cancer:upper-outer quadrant of right breast   Histology per Pathology Report:   04/03/2022 Diagnosis 1. Breast, right, needle core biopsy, 11:00 7 cmfn - INVASIVE DUCTAL CARCINOMA, SEE NOTE - TUBULE FORMATION: SCORE 3 - NUCLEAR PLEOMORPHISM: SCORE 2 - MITOTIC COUNT: SCORE 3 - TOTAL SCORE: 8 - OVERALL GRADE: 3 - LYMPHOVASCULAR INVASION: PRESENT - CANCER LENGTH: 0.6 CM - CALCIFICATIONS: NOT IDENTIFIED - OTHER FINDINGS: FOCAL NECROSIS 2. Lymph node, needle/core biopsy, right axilla - INVASIVE CARCINOMA INVOLVING FIBROADIPOSE TISSUE  06/08/2022 FINAL MICROSCOPIC DIAGNOSIS:   A. BREAST, RIGHT, LUMPECTOMY:  - Invasive ductal carcinoma, 5.5 cm, grade 3  - Resection margins are negative for carcinoma; closest is the posterior  margin and 0.2 cm  - Biopsy site changes  - See oncology table   B. 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 BREAST, RIGHT, MAMMOPLASTY:  - Benign breast parenchyma, negative for carcinoma   D. BREAST, LEFT, MAMMOPLASTY:  - Benign breast parenchyma, negative for carcinoma    Receptor Status: ER(60%), PR (0%), Her2-neu (negative), Ki-(60%)  Did patient present with symptoms (if so, please note symptoms) or was this found on screening mammography?: Patient had a palpable mass.  Past/Anticipated interventions by surgeon, if any: RIGHT BREAST RADIOACTIVE SEED GUIDED LUMPECTOMY RIGHT DEEP AXILLARY RADIOACTIVE SEED GUIDED TARGETED LYMPH NODE DISSECTION by Dr. [name] 06/08/2022 and bilateral oncoplastic breast reduction with superomedial pedicle. By Dr. Erin Hearing 06/08/22   Past/Anticipated interventions by medical oncology, if any: Neoadjuvant chemotherapy with Taxol Doristine Church followed by Adriamycin Cytoxan Keytruda followed by Beryle Flock maintenance for 1 year. Adjuvant antiestrogen therapy if the  final prognostic panel still shows estrogen receptor positivity.   Lymphedema issues, if any:  {:18581} {t:21944}   Pain issues, if any:  {:18581} {PAIN DESCRIPTION:21022940}  SAFETY ISSUES: Prior radiation? {:18581} Pacemaker/ICD? {:18581} Possible current pregnancy?{:18581} Is the patient on methotrexate? {:18581}  Current Complaints / other details:  ***    Jacqulyn Liner, RN 07/01/2022,8:16 AM

## 2022-07-01 NOTE — Therapy (Cosign Needed Addendum)
OUTPATIENT PHYSICAL THERAPY BREAST CANCER POST OP FOLLOW UP   Patient Name: Colleen Montgomery MRN: [id number] DOB:March 24, 1966, 56 y.o., female Today's Date: 07/01/2022   PT End of Session - 07/01/22 0757     Visit Number 2    Number of Visits 12    Date for PT Re-Evaluation 08/19/22    PT Start Time 0758    PT Stop Time 0832    PT Time Calculation (min) 34 min    Activity Tolerance Patient tolerated treatment well    Behavior During Therapy [facility] for tasks assessed/performed             Past Medical History:  Diagnosis Date   Breast cancer ([place])    Diverticulosis    Hypothyroidism    Thyroid disease    Past Surgical History:  Procedure Laterality Date   APPENDECTOMY     BREAST LUMPECTOMY Right 06/08/2022   Procedure: RIGHT BREAST LUMPECTOMY;  Surgeon: Coralie Keens, MD;  Location: [facility];  Service: General;  Laterality: Right;   BREAST REDUCTION SURGERY Bilateral 06/08/2022   Procedure: BILATERAL ONCOPLASTIC BREAST REDUCTION;  Surgeon: [name], MD;  Location: [place];  Service: Plastics;  Laterality: Bilateral;   ECTOPIC PREGNANCY SURGERY  1996   RADIOACTIVE SEED GUIDED AXILLARY SENTINEL LYMPH NODE Right 06/08/2022   Procedure: RADIOACTIVE SEED GUIDED RIGHT AXILLARY SENTINEL LYMPH NODE DISSECTION;  Surgeon: Coralie Keens, MD;  Location: [place];  Service: General;  Laterality: Right;   Patient Active Problem List   Diagnosis Date Noted   Genetic testing 04/27/2022   Family history of breast cancer 04/16/2022   Family history of colon cancer 04/16/2022   Family history of prostate cancer in father 04/16/2022   Malignant neoplasm of upper-outer quadrant of right breast in female, estrogen receptor positive ([place]) 04/13/2022   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 Coralie Keens  REFERRING DIAG: Right breast cancer  THERAPY DIAG:  Malignant neoplasm of upper-outer quadrant of right breast in female, estrogen receptor positive ([place])  Abnormal posture  Rationale for Evaluation and Treatment: Rehabilitation  ONSET DATE: 04/03/2022  SUBJECTIVE:                                                                                                                                                                                           SUBJECTIVE STATEMENT: She feels like she  is doing well. She is only limited based on the plastic surgeon requirements. She says she has like a dull ache but no true pain.   PERTINENT HISTORY:  Patient was diagnosed on 04/03/2022 with right 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 It measures 1.9 cm and is located in the upper outer quadrant. It is ER positive, PR negative and HER2 negative with a Ki67 of 60%. She has a positive axillary lymph node.   PATIENT GOALS:  Reassess how my recovery is going related to arm function, pain, and swelling.  PAIN:  Are you having pain? No  PRECAUTIONS: Recent Surgery, right UE Lymphedema risk, None From Donnamarie Rossetti E, PA-C :Gentle stretching and light ROM, no full overhead motion, and no lifting anything greater than 10-15 pounds for 6 week.  ACTIVITY LEVEL / LEISURE: No specific exercise except she regularly walks her very active dog who is part [place].     OBJECTIVE:   PATIENT SURVEYS:  QUICK DASH: 2.27  OBSERVATIONS: Cording present right axilla   POSTURE:  Rounded shoulder posture   LYMPHEDEMA ASSESSMENT:   UPPER EXTREMITY AROM/PROM:   A/PROM RIGHT   eval   07/01/2022  Shoulder extension 50 75  Shoulder flexion 148 165  Shoulder abduction 175 170  Shoulder internal rotation 63 80  Shoulder external rotation 90 95                           (Blank rows = not tested)   A/PROM LEFT   eval 07/01/2022  Shoulder extension 86 65  Shoulder flexion 153 172  Shoulder abduction 177 178  Shoulder internal rotation 57 85  Shoulder external rotation 87 90                          (Blank rows = not tested)     CERVICAL AROM: All within normal limits   UPPER EXTREMITY STRENGTH: WNL     LYMPHEDEMA ASSESSMENTS:    LANDMARK RIGHT   eval 07/01/2022  10 cm proximal to olecranon process 28.4 27.5  Olecranon process 24.2 22.  10 cm proximal to ulnar styloid process 21.2 20.5  Just proximal to ulnar styloid process 14.6 14.4  Across hand at thumb web space 17.5 18  At base of 2nd digit 6.2 5.5  (Blank rows = not tested)   LANDMARK LEFT   eval  10 cm proximal to olecranon process 27.5  Olecranon process 24.2  10 cm proximal to ulnar styloid process 20  Just proximal to ulnar styloid process 14.7  Across hand at thumb web space 17.5  At base of 2nd digit 6.2  (Blank rows = not tested)  Surgery type/Date: Right Breast Lumpectomy; right axillary sentinel lymph node dissection, Number of lymph nodes removed: 2 Current/past treatment (chemo, radiation, hormone therapy): chemotherapy, radiation , +/- antiestrogens Other symptoms:  Heaviness/tightness No Pain No Pitting edema No Infections No  PATIENT EDUCATION:  Education details: ABCC, Scar massage, compression garment, HEP, walking program, posture Person educated: Patient Education method: Explanation, Demonstration, and Handouts Education comprehension: verbalized understanding  HOME EXERCISE PROGRAM: Reviewed previously given post op HEP.   ASSESSMENT:  CLINICAL IMPRESSION: Patient presented to clinic post lumpectomy and lymph node dissection. Patient has cording present in right axillary near the incision site. Patient has no limitation in ROM but has a pulling sensation that is uncomfortable that preventing her from doing functional activities.   Pt  will  benefit from skilled therapeutic intervention to improve on the following deficits: Decreased knowledge of precautions, impaired UE functional use, pain, decreased ROM, postural dysfunction.   PT treatment/interventions: ADL/Self care home management, Therapeutic exercises, Therapeutic activity, Neuromuscular re-education, Balance training, Gait training, Patient/Family education, Self Care, Joint mobilization, Dry Needling, Cryotherapy, Moist heat, Manual lymph drainage, Compression bandaging, scar mobilization, Taping, and Manual therapy   GOALS: Goals reviewed with patient? Yes  LONG TERM GOALS:  (STG=LTG)  GOALS Name Target Date  Goal status  1 Pt will demonstrate she has regained full shoulder ROM and function post operatively compared to baselines.  Baseline: 07/01/2022 MET  2  Pt will report 75% less discomfort with shoulder ROM from the right axillary cording  08/19/2022 INITIAL  3 Pt will be independent with advanced HEP to maintain improvements made throughout therapy  08/19/2022 INITIAL     PLAN:  PT FREQUENCY/DURATION: 2 x 6 weeks   PLAN FOR NEXT SESSION: Manual stretching and STM to cording, and gentle ROM, what did plastic surgery say?    Rosario Jacks, Student-PT 07/01/2022  8:51 AM

## 2022-07-02 ENCOUNTER — Encounter: Payer: Self-pay | Admitting: *Deleted

## 2022-07-02 ENCOUNTER — Other Ambulatory Visit: Payer: Self-pay

## 2022-07-02 ENCOUNTER — Ambulatory Visit (INDEPENDENT_AMBULATORY_CARE_PROVIDER_SITE_OTHER): Admitting: Student

## 2022-07-02 ENCOUNTER — Ambulatory Visit
Admission: RE | Admit: 2022-07-02 | Discharge: 2022-07-02 | Disposition: A | Discharge status: Home / Self Care | Source: Ambulatory Visit | Attending: Radiation Oncology | Admitting: Radiation Oncology

## 2022-07-02 ENCOUNTER — Encounter: Payer: Self-pay | Admitting: Student

## 2022-07-02 ENCOUNTER — Encounter: Payer: Self-pay | Admitting: Radiation Oncology

## 2022-07-02 VITALS — BP 109/71 | HR 80 | Temp 97.8°F | Resp 18 | Ht 66.0 in | Wt 166.4 lb

## 2022-07-02 VITALS — BP 105/70 | HR 92

## 2022-07-02 DIAGNOSIS — C50411 Malignant neoplasm of upper-outer quadrant of right female breast: Secondary | ICD-10-CM

## 2022-07-02 DIAGNOSIS — Z170 Estrogen receptor positive status [ER+]: Secondary | ICD-10-CM

## 2022-07-02 DIAGNOSIS — Z171 Estrogen receptor negative status [ER-]: Secondary | ICD-10-CM | POA: Diagnosis not present

## 2022-07-02 MED ORDER — DOXYCYCLINE HYCLATE 100 MG PO TABS: 100.0000 mg | ORAL_TABLET | Freq: Two times a day (BID) | ORAL | 0 refills | Status: AC

## 2022-07-02 NOTE — Progress Notes (Signed)
Patient is a 56 year old female who underwent right breast lumpectomy and right deep axillary lymph node dissection with Dr. [name] followed by bilateral oncoplastic breast reduction with Dr. Erin Hearing on 06/08/2022. She is 3 weeks postop. Patient presents to the clinic today for postoperative follow-up.  Patient was last seen in the clinic on 06/25/2022.  At this visit, patient reported she is doing well.  On exam, breasts were soft and fairly symmetric.  There were no significant fluid collections palpated on exam.  NAC's were viable bilaterally.  To the left breast inferior T-zone, there appeared to be some irritation versus early cellulitis.  There is no drainage noted from this area.  Patient was started on Keflex and instructed to follow-up in the clinic in 1 week.  Patient called the clinic on 06/29/2022.  She called stating that she developed a rash after starting the Keflex.  Patient had stopped the Keflex and started taking Benadryl.  Is recommended that the patient monitor her symptoms and call us if anything worsens.  Patient expressed understanding.  Today, patient reports she is doing well.  She states that she feels like her rash has been improving.  She reports that the rash has definitely been fading to the ankles, but reports it is still present to her left lower back.  She reports she has been taking Benadryl and applying hydrocortisone cream to the areas.  Patient denies any fevers or chills.  She reports the area of redness to her left lower T-zone looks similar to when we last examined her.  Chaperone present on exam.  On exam, patient is sitting upright in no acute distress.  Her breathing is normal and unlabored.  There is a mild maculopapular rash noted to her ankles bilaterally.  There is a maculopapular rash noted to her left lower back with some mild erythema noted.    Patient's breasts are soft and fairly symmetric bilaterally.  There are no significant fluid collections  palpated on exam.  There are no areas of fluctuance noted.  The incision to the right lateral aspect of the breast is intact and healing well.  There appears to be a little bit of irritation at the medial aspect of it.  There is no drainage noted.  There is a little bit of firmness underneath it that appears to be consistent with either scarring or fat necrosis.  There is no fluctuance noted.  NAC's appear viable bilaterally.  To the right breast, there appears to be a little bit of irritation noted to the skin just medial of the right NAC near a V-Loc suture protruding from the skin.  There is also some irritation noted to the inferior aspect of the right NAC and to the inferior T-zone near a V-Loc suture knot.  There is no drainage noted.  Incision is otherwise intact.  To the left breast, there is some irritation noted to the medial aspect and inferior aspect of the NAC.  There are VueLock sutures noted in the spots.  There is some irritation as well to the inferior T-zone of the left breast.  It appears to be similar to previous exam, slightly improved.  There is a V-Loc suture noted to this area.  There is no drainage noted.  Incision is otherwise intact.  All of the protruding V Lock sutures were trimmed.  Patient tolerated well.  I discussed with the patient that she can take an antihistamine such as Zyrtec or Claritin during the day, and take Benadryl at night  to help clear up her rash.  I discussed that she can also apply a little hydrocortisone cream to the left lower back. I discussed with the patient that if her rash does not improve, a short course of oral steroids could be an option for her.  Patient expressed understanding.  I discussed with the patient to monitor the areas of irritation to her breasts.  These were marked with a sterile skin marker.  I discussed with her that my suspicion for infection is low as all of the areas of irritation appear to be consistent where a V-Loc suture was  protruding from the skin.  I am going to send in some doxycycline for the patient to have if the redness worsens. Patient reports she took tetracyclines as a teenager and did well with them.   I discussed with the patient that I want her to call us if she feels it is worsening and has to to take the doxycycline.  I also instructed the patient to call us if she develops any fevers, chills, worsening redness, increased pain, drainage or worsening symptoms.  Patient expressed understanding and was in agreement with this plan.  I discussed with the patient to apply Vaseline daily to her incisions.  I discussed that she can apply an ABD pad over the top of her incisions for padding between the incision and her bra.  I encouraged the patient to gently massage her right lateral incision.  Patient expressed understanding.  Patient states she will be going out of town again to [place] for treatments this weekend.  She reports that she will not be back until Tuesday.  I will do a televisit with the patient on Monday to check in and see how she is doing, and then we will see her on Tuesday.  Patient was in agreement with this plan.  I instructed the patient to call back she has any questions or concerns.  Pictures were obtained of the patient and placed in the chart with the patient's or guardian's permission.

## 2022-07-06 ENCOUNTER — Telehealth: Payer: Self-pay | Admitting: Student

## 2022-07-06 ENCOUNTER — Ambulatory Visit (INDEPENDENT_AMBULATORY_CARE_PROVIDER_SITE_OTHER): Admitting: Student

## 2022-07-06 DIAGNOSIS — C50411 Malignant neoplasm of upper-outer quadrant of right female breast: Secondary | ICD-10-CM

## 2022-07-06 NOTE — Progress Notes (Signed)
Patient is a 56 year old female who underwent right breast lumpectomy and right deep axillary lymph node dissection with Dr. [name] followed by bilateral oncoplastic breast reduction with Dr. Erin Hearing on 06/08/2022.  Patient presents for telephone follow-up today for follow-up from her visit last week.  This patient was last seen in the clinic on 07/02/2022.  At this visit, patient reported that her rash was improving after her reaction to Keflex.  On exam, there appear to be several areas of irritation which appear to be consistent to where the V-Loc sutures were placed outside the skin.  Plan was for patient to continue to take antihistamines during the day and Benadryl at night, and to apply hydrocortisone cream to the left lower back.  Patient was also to monitor the areas of irritation to her breast.  Doxycycline was sent in for the patient in case these worsened.  Patient was to continue Vaseline daily to her incisions as well.  Today, patient reports she is doing well.  She states that her rash has drastically improved.  She reports she has been taking Claritin during the day and feels like this has been helping.  Patient does report she noticed a little bit of redness underneath her right breast a few days ago and started taking the doxycycline.  She states that she is taken about 3 doses.  She denies any fevers or chills.  She denies any drainage from her incisions or swelling near her incisions.  Patient denies any other issues or concerns at this time.  I discussed with the patient to continue the antihistamines to help with the rash.  I discussed with the patient she should continue to take doxycycline all the way through and we will plan to see her early on Wednesday morning.  I discussed with the patient that if she were to develop any fevers, chills, pain, drainage or any worsening symptoms, she should let us know.  Patient expressed understanding.  The patient gave consent to have this visit  done by telemedicine / virtual visit, two identifiers were used to identify patient. This is also consent for access the chart and treat the patient via this visit. The patient is located in [place].  I, the provider, am at the office.  We spent 5 minutes together for the visit.  Joined by telephone.

## 2022-07-06 NOTE — Telephone Encounter (Signed)
Please give call would like prescription Difleucam since she has been on an antibiotic.

## 2022-07-07 ENCOUNTER — Other Ambulatory Visit: Payer: Self-pay | Admitting: Student

## 2022-07-07 MED ORDER — FLUCONAZOLE 150 MG PO TABS: 150.0000 mg | ORAL_TABLET | Freq: Every day | ORAL | 0 refills | Status: AC

## 2022-07-07 NOTE — Progress Notes (Signed)
Patient is a 56 year old female who underwent right breast lumpectomy and right deep axillary lymph node dissection with Dr. [name] followed by bilateral oncoplastic breast reduction with Dr. Erin Hearing on 06/08/2022.  Patient presents to clinic today for postoperative follow-up.     I had a telephone encounter with the patient on 07/06/2022.  During this encounter, patient reported that her rash had drastically improved.  She reported that she was taking Claritin during the day and Benadryl at night.  Patient also reported that she noticed a little bit of redness underneath her right breast and started taking the doxycycline.  She denied any fevers or chills.  Plan is for patient to continue the antihistamines and to continue the doxycycline.  Plan was for patient to call if she felt her symptoms were worsening or if she developed any fevers or chills.  Today, patient reports she is doing well.  She states that she feels her rash is continuing to improve.  Patient reports she has another day or so of the doxycycline left.  She states that she feels the redness to her breasts has not gotten any worse, but it is still present in certain areas.  She denies any fevers or chills.  She denies any drainage or new swelling to her breasts.  Chaperone present on exam.  On exam, patient is sitting upright in no acute distress.  Breasts are soft and symmetric bilaterally.  NAC's appear viable bilaterally.  To the right breast, There is an area of irritation to the inferior aspect of the NAC.  It appears there is a small superficial wound here.  There is no drainage noted from the wound.  The area of redness to the medial aspect of the NAC appears to be improved from previous exam.  There is also an area of irritation to the inferior T-zone, that looks slightly improved from previous exam.  There is a little bit of V-Loc suture protruding from the skin to this area.  This was removed and patient tolerated well.  Incision  is otherwise intact.  Incision to the right lateral breast is intact.  There is a small Monocryl suture knot protruding from the skin.  This was cut and removed.  Patient tolerated well.  To the left breast, there is a small wound to the medial aspect of the NAC and to the inferior aspect of the NAC.  They appear slightly irritated, but fairly similar to previous exam.  At the inferior T-zone, there appears to be a small blister forming.  There is some surrounding erythema.  The erythema does not appear worse from previous exam.  There is no drainage noted.  Rash to the lower left back has significantly improved present since previous exam, and the rash to her ankles bilaterally has also significantly improved and almost is resolved.  I discussed with the patient that the areas of redness still appear to be more consistent with irritation than with infection.  I discussed with the patient though that should monitor these areas very closely, and if they worsen to let us know.  Patient expressed understanding.  I discussed with the patient that it looks like she has a small blister forming at the inferior T-zone of her left breast.  I discussed with her that this may eventually slough off and form a small wound, which we will have to continue to do wound care to.  Patient expressed understanding.  I discussed with the patient she should continue to apply Vaseline daily  to her incisions.  I discussed that she should keep the blister to her left inferior T-zone covered with gauze or an ABD pad.  Patient expressed understanding.  I told the patient to finish her antibiotics.  I told her to monitor the areas of redness and if that they worsen, start draining, or if she develops fevers or chills, she needs to let us know.  Patient expressed understanding.  Patient to follow-up next week.  Pictures were obtained of the patient and placed in the chart with the patient's or guardian's permission.

## 2022-07-07 NOTE — Progress Notes (Signed)
Patient reports she is having yeast infection due to antibiotics. Will send in fluconazole.

## 2022-07-08 ENCOUNTER — Encounter: Payer: Self-pay | Admitting: Student

## 2022-07-08 ENCOUNTER — Ambulatory Visit (INDEPENDENT_AMBULATORY_CARE_PROVIDER_SITE_OTHER): Admitting: Student

## 2022-07-08 VITALS — BP 105/75 | HR 73

## 2022-07-08 DIAGNOSIS — C50411 Malignant neoplasm of upper-outer quadrant of right female breast: Secondary | ICD-10-CM

## 2022-07-13 ENCOUNTER — Telehealth: Admitting: Student

## 2022-07-15 ENCOUNTER — Encounter: Payer: Self-pay | Admitting: Physician Assistant

## 2022-07-15 ENCOUNTER — Ambulatory Visit (INDEPENDENT_AMBULATORY_CARE_PROVIDER_SITE_OTHER): Admitting: Physician Assistant

## 2022-07-15 ENCOUNTER — Ambulatory Visit: Admitting: Rehabilitation

## 2022-07-15 VITALS — BP 115/72 | HR 86

## 2022-07-15 DIAGNOSIS — C50411 Malignant neoplasm of upper-outer quadrant of right female breast: Secondary | ICD-10-CM

## 2022-07-15 NOTE — Progress Notes (Signed)
This is a 56 year old female who underwent right breast lumpectomy and right deep axillary lymph node dissection with Dr. [name] followed by bilateral oncoplastic breast reduction with Dr. Erin Hearing on 06/08/2022.  Patient presents to clinic today for postoperative follow-up.   She was last seen in the office on 07/08/2022.  At that time she had been doing well, she had a rash that had significantly improved with Claritin and Benadryl, she had completed her doxycycline.  Since her last office visit she notes she has been doing well, she denies any issues or concerns, notes rash is completely gone, she denies any infectious signs or symptoms.  Chaperone present.  On exam bilateral breasts are symmetric with no overlying redness, no drainage.  They are soft with no palpable fluid collections or areas of firmness.  Bilateral NAC's are viable.  She has several small superficial wounds 1 at the right 5 o'clock position surrounding the areola, and a another at the left inferior T-zone.  Photos taken today  Overall the patient is doing well, she has some superficial wounds that appear to be healing without issue.  She has no signs of infection.  At this time the patient may follow-up in 2 weeks for repeat evaluation, or sooner as needed.  She verbalized understanding and agreement to today's plan had no further questions or concerns.  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

## 2022-07-16 ENCOUNTER — Encounter: Payer: Self-pay | Admitting: *Deleted

## 2022-07-17 ENCOUNTER — Ambulatory Visit: Attending: Surgery | Admitting: Rehabilitation

## 2022-07-17 ENCOUNTER — Encounter: Payer: Self-pay | Admitting: Rehabilitation

## 2022-07-17 DIAGNOSIS — C50411 Malignant neoplasm of upper-outer quadrant of right female breast: Secondary | ICD-10-CM

## 2022-07-17 DIAGNOSIS — M25611 Stiffness of right shoulder, not elsewhere classified: Secondary | ICD-10-CM

## 2022-07-17 DIAGNOSIS — Z483 Aftercare following surgery for neoplasm: Secondary | ICD-10-CM | POA: Diagnosis present

## 2022-07-17 DIAGNOSIS — Z170 Estrogen receptor positive status [ER+]: Secondary | ICD-10-CM | POA: Diagnosis present

## 2022-07-17 DIAGNOSIS — R293 Abnormal posture: Secondary | ICD-10-CM | POA: Diagnosis present

## 2022-07-17 NOTE — Therapy (Signed)
OUTPATIENT PHYSICAL THERAPY BREAST CANCER TREATMENT   Patient Name: Colleen Montgomery MRN: [id number] DOB:02-10-1966, 56 y.o., female Today's Date: 07/17/2022   PT End of Session - 07/17/22 0805     Visit Number 3    Number of Visits 12    Date for PT Re-Evaluation 08/19/22    PT Start Time 0806    PT Stop Time 0849    PT Time Calculation (min) 43 min    Activity Tolerance Patient tolerated treatment well    Behavior During Therapy [facility] for tasks assessed/performed              Past Medical History:  Diagnosis Date   Breast cancer ([place])    Diverticulosis    Hypothyroidism    Thyroid disease    Past Surgical History:  Procedure Laterality Date   APPENDECTOMY     BREAST LUMPECTOMY Right 06/08/2022   Procedure: RIGHT BREAST LUMPECTOMY;  Surgeon: Coralie Keens, MD;  Location: [place];  Service: General;  Laterality: Right;   BREAST REDUCTION SURGERY Bilateral 06/08/2022   Procedure: BILATERAL ONCOPLASTIC BREAST REDUCTION;  Surgeon: [name], MD;  Location: [place];  Service: Plastics;  Laterality: Bilateral;   ECTOPIC PREGNANCY SURGERY  1996   RADIOACTIVE SEED GUIDED AXILLARY SENTINEL LYMPH NODE Right 06/08/2022   Procedure: RADIOACTIVE SEED GUIDED RIGHT AXILLARY SENTINEL LYMPH NODE DISSECTION;  Surgeon: Coralie Keens, MD;  Location: [place];  Service: General;  Laterality: Right;   Patient Active Problem List   Diagnosis Date Noted   Genetic testing 04/27/2022   Family history of breast cancer 04/16/2022   Family history of colon cancer 04/16/2022   Family history of prostate cancer in father 04/16/2022   Malignant neoplasm of upper-outer quadrant of right breast in female, estrogen receptor positive ([place]) 04/13/2022   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 Coralie Keens  REFERRING DIAG: Right breast cancer  THERAPY DIAG:  Malignant neoplasm of upper-outer quadrant of right breast in female, estrogen receptor positive ([place])  Abnormal posture  Stiffness of right shoulder, not elsewhere classified  Aftercare following surgery for neoplasm  Rationale for Evaluation and Treatment: Rehabilitation  ONSET DATE: 04/03/2022  SUBJECTIVE:  SUBJECTIVE STATEMENT: The cording is still there.     PERTINENT HISTORY:  Patient was diagnosed on 04/03/2022 with right 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 It measures 1.9 cm and is located in the upper outer quadrant. It is ER positive, PR negative and HER2 negative with a Ki67 of 60%. She has a positive axillary lymph node. Had a Rt lumpectomy with bil reduction on 06/08/22.  1/2 positive lymph nodes.   PATIENT GOALS:  Reassess how my recovery is going related to arm function, pain, and swelling.  PAIN:  Are you having pain? No  PRECAUTIONS: Recent Surgery, right UE Lymphedema risk, From Donnamarie Rossetti E, PA-C :Gentle stretching and light ROM, no full overhead motion, and no lifting anything greater than 10-15 pounds for 6 week. (07/20/22)  ACTIVITY LEVEL / LEISURE: No specific exercise except she regularly walks her very active dog who is part [place].     OBJECTIVE:   PATIENT SURVEYS:  QUICK DASH: 2.27  OBSERVATIONS: Cording present right axilla   POSTURE:  Rounded shoulder posture   LYMPHEDEMA ASSESSMENT:   UPPER EXTREMITY AROM/PROM:   A/PROM RIGHT   eval   07/01/2022  Shoulder extension 50 75  Shoulder flexion 148 165  Shoulder abduction 175 170  Shoulder internal  rotation 63 80  Shoulder external rotation 90 95                          (Blank rows = not tested)   A/PROM LEFT   eval 07/01/2022  Shoulder extension 86 65  Shoulder flexion 153 172  Shoulder abduction 177 178  Shoulder internal rotation 57 85  Shoulder external rotation 87 90                          (Blank rows = not tested)     CERVICAL AROM: All within normal limits   UPPER EXTREMITY STRENGTH: WNL     LYMPHEDEMA ASSESSMENTS:    LANDMARK RIGHT   eval 07/01/2022  10 cm proximal to olecranon process 28.4 27.5  Olecranon process 24.2 22.  10 cm proximal to ulnar styloid process 21.2 20.5  Just proximal to ulnar styloid process 14.6 14.4  Across hand at thumb web space 17.5 18  At base of 2nd digit 6.2 5.5  (Blank rows = not tested)   LANDMARK LEFT   eval  10 cm proximal to olecranon process 27.5  Olecranon process 24.2  10 cm proximal to ulnar styloid process 20  Just proximal to ulnar styloid process 14.7  Across hand at thumb web space 17.5  At base of 2nd digit 6.2  (Blank rows = not tested)  Surgery type/Date: Right Breast Lumpectomy; right axillary sentinel lymph node dissection, Number of lymph nodes removed: 2 Current/past treatment (chemo, radiation, hormone therapy): chemotherapy, radiation , +/- antiestrogens Other symptoms:  Heaviness/tightness No Pain No Pitting edema No Infections No  TODAY"S TREATMENT 07/17/22 Manual: Cording release in supine to the Rt UE: axilla to lower medial arm with and without cocoa butter - using sustained pressure and STM PROM with axillary pinning into flexion and D2 Axillary circles and upper arm MLD post MFR TE: Printed out supine scap and gave pt yellow band with demonstration and performance without band by patient per below (no flexion), discussed current stretches.   PATIENT EDUCATION:  Education details: ABCClass, Scar massage, compression garment, HEP, walking program, posture, supine scap, cording Person  educated: Patient Education method: Explanation, Demonstration, and Handouts Education comprehension: verbalized understanding  HOME EXERCISE PROGRAM: Reviewed previously given post op HEP.  Access Code: [email or web address] URL: [email or web address] Date: 07/17/2022 Prepared by: Shan Levans  Exercises - Supine Shoulder Horizontal Abduction with Resistance  - 1 x daily - 3 x weekly - 1-3 sets - 10 reps - Supine Shoulder External Rotation with Resistance  - 1 x daily - 3 x weekly - 1-3 sets - 10 reps - Supine PNF D2 Flexion with Resistance  - 1 x daily - 3 x weekly - 1-3 sets - 10 reps  ASSESSMENT:  CLINICAL IMPRESSION:  Pt has 1 cord noted in the axilla into the upper medial arm but feels it pull into the medial lower forearm.  This cord is present but not limiting any motion.  Pt was educated on return to gym exercise and weight lifting and given yellow band TE to start the transition.   Pt will benefit from skilled therapeutic intervention to improve on the following deficits: Decreased knowledge of precautions, impaired UE functional use, pain, decreased ROM, postural dysfunction.   PT treatment/interventions: ADL/Self care home management, Therapeutic exercises, Therapeutic activity, Neuromuscular re-education, Balance training, Gait training, Patient/Family education, Self Care, Joint mobilization, Dry Needling, Cryotherapy, Moist heat, Manual lymph drainage, Compression bandaging, scar mobilization, Taping, and Manual therapy   GOALS: Goals reviewed with patient? Yes  LONG TERM GOALS:  (STG=LTG)  GOALS Name Target Date  Goal status  1 Pt will demonstrate she has regained full shoulder ROM and function post operatively compared to baselines.  Baseline: 07/01/2022 MET  2  Pt will report 75% less discomfort with shoulder ROM from the right axillary cording  08/19/2022 INITIAL  3 Pt will be independent with advanced HEP to maintain improvements made throughout therapy  08/19/2022  INITIAL     PLAN:  PT FREQUENCY/DURATION: 2 x 6 weeks   PLAN FOR NEXT SESSION: Manual stretching and STM to cording, and gentle ROM, start more stretches and AAROM    Shan Levans, PT  07/17/2022  8:49 AM

## 2022-07-20 ENCOUNTER — Ambulatory Visit: Admitting: Radiation Oncology

## 2022-07-22 ENCOUNTER — Encounter: Payer: Self-pay | Admitting: Student

## 2022-07-22 ENCOUNTER — Encounter: Payer: Self-pay | Admitting: Rehabilitation

## 2022-07-22 ENCOUNTER — Ambulatory Visit: Admitting: Rehabilitation

## 2022-07-22 ENCOUNTER — Ambulatory Visit (INDEPENDENT_AMBULATORY_CARE_PROVIDER_SITE_OTHER): Admitting: Student

## 2022-07-22 VITALS — BP 129/73 | HR 80 | Temp 98.2°F

## 2022-07-22 DIAGNOSIS — Z483 Aftercare following surgery for neoplasm: Secondary | ICD-10-CM

## 2022-07-22 DIAGNOSIS — R293 Abnormal posture: Secondary | ICD-10-CM

## 2022-07-22 DIAGNOSIS — C50411 Malignant neoplasm of upper-outer quadrant of right female breast: Secondary | ICD-10-CM

## 2022-07-22 DIAGNOSIS — M25611 Stiffness of right shoulder, not elsewhere classified: Secondary | ICD-10-CM

## 2022-07-22 NOTE — Progress Notes (Signed)
Patient is a 56 year old female who underwent right breast lumpectomy and right deep axillary node dissection with Dr. [name] followed by bilateral oncoplastic breast reduction with Dr. Erin Hearing on 06/08/2022.  Patient presents to the clinic today with concerns about a wound she has to her left breast.  Patient was last seen in the clinic on 07/15/2022.  At this visit, patient reported she was doing well.  She reported that her rash had completely gone away.  On exam, breasts were symmetric with no overlying redness or drainage bilaterally.  NAC's were viable bilaterally.  There were several small superficial wounds 1 at the right 5 o'clock position surrounding the areola and another at the left and inferior T-zone.  Plan is for patient to follow-up in 2 weeks.  Today, patient reports she is doing well.  She states that on Monday night, she noticed some yellowish drainage on the ABD pad she was placing to her left breast.  She denies any fever or chills.  She denies any other issues or concerns.    Chaperone present on exam.  On exam, patient is sitting upright in no acute distress.  Breasts are fairly symmetric and soft bilaterally.  NAC's appear viable bilaterally.  There are no significant fluid collections palpated on exam.  Incisions appear to be healing well to the right breast.  There are no significant wounds noted to the right breast.  To the left breast, there is a small superficial wound noted at the medial aspect of the left NAC, a small superficial wound noted to the superior aspect of the vertical limb incision, and a small superficial 1 cm x 0.5 cm wound to the inferior T-zone.  There is some mild irritation surrounding the wounds to the inferior T-zone and to the superior aspect of the vertical limb.  There is some mild exudate/slough noted to these areas.  There is no purulent drainage or malodor noted on exam.  I discussed with the patient that her wounds do not appear to be infected at  this time.  I discussed with the patient that the blister to the inferior T-zone of the left breast that she was developing a few weeks ago has now opened up.  I discussed with the patient that it appears there is some exudate/slough in the wound.  I discussed with the patient that she should continue Vaseline daily to the wounds to the left inferior T-zone and to the superior aspect of the vertical limb incision.  I discussed that she can place a nonadherent gauze such as Telfa over this, covered by ABD pads.  Patient expressed understanding.  I discussed with the patient that she should also continue to apply Vaseline to the wound to the medial aspect of the left NAC.  I discussed with the patient to continue to monitor the wounds, and if they begin to worsen, drain more, if they become more painful, or if she has any concerns to call us and let us know.  Patient has an appointment already for next week.  I instructed the patient to call in the meantime with any questions or concerns.  Pictures were obtained of the patient and placed in the chart with the patient's or guardian's permission.

## 2022-07-22 NOTE — Therapy (Signed)
OUTPATIENT PHYSICAL THERAPY BREAST CANCER TREATMENT   Patient Name: Colleen Montgomery MRN: [id number] DOB:06/22/1966, 56 y.o., female Today's Date: 07/22/2022   PT End of Session - 07/22/22 1055     Visit Number 4    Number of Visits 12    Date for PT Re-Evaluation 08/19/22    PT Start Time 0906    PT Stop Time 0955    PT Time Calculation (min) 49 min    Activity Tolerance Patient tolerated treatment well    Behavior During Therapy [facility] for tasks assessed/performed               Past Medical History:  Diagnosis Date   Breast cancer ([place])    Diverticulosis    Hypothyroidism    Thyroid disease    Past Surgical History:  Procedure Laterality Date   APPENDECTOMY     BREAST LUMPECTOMY Right 06/08/2022   Procedure: RIGHT BREAST LUMPECTOMY;  Surgeon: Coralie Keens, MD;  Location: [place];  Service: General;  Laterality: Right;   BREAST REDUCTION SURGERY Bilateral 06/08/2022   Procedure: BILATERAL ONCOPLASTIC BREAST REDUCTION;  Surgeon: [name], MD;  Location: [place];  Service: Plastics;  Laterality: Bilateral;   ECTOPIC PREGNANCY SURGERY  1996   RADIOACTIVE SEED GUIDED AXILLARY SENTINEL LYMPH NODE Right 06/08/2022   Procedure: RADIOACTIVE SEED GUIDED RIGHT AXILLARY SENTINEL LYMPH NODE DISSECTION;  Surgeon: Coralie Keens, MD;  Location: [place];  Service: General;  Laterality: Right;   Patient Active Problem List   Diagnosis Date Noted   Genetic testing 04/27/2022   Family history of breast cancer 04/16/2022   Family history of colon cancer 04/16/2022   Family history of prostate cancer in father 04/16/2022   Malignant neoplasm of upper-outer quadrant of right breast in female, estrogen receptor positive ([place]) 04/13/2022   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 Coralie Keens  REFERRING DIAG: Right breast cancer  THERAPY DIAG:  Malignant neoplasm of upper-outer quadrant of right breast in female, estrogen receptor positive ([place])  Stiffness of right shoulder, not elsewhere classified  Abnormal posture  Aftercare following surgery for neoplasm  Rationale for Evaluation and Treatment: Rehabilitation  ONSET DATE: 04/03/2022  SUBJECTIVE:  SUBJECTIVE STATEMENT:  I am okay to do all exercise.  I was able to take off my compression bra.    PERTINENT HISTORY:  Patient was diagnosed on 04/03/2022 with right 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 It measures 1.9 cm and is located in the upper outer quadrant. It is ER positive, PR negative and HER2 negative with a Ki67 of 60%. She has a positive axillary lymph node. Had a Rt lumpectomy with bil reduction on 06/08/22.  1/2 positive lymph nodes.   PATIENT GOALS:  Reassess how my recovery is going related to arm function, pain, and swelling.  PAIN:  Are you having pain? No  PRECAUTIONS: Recent Surgery, right UE Lymphedema risk, From Donnamarie Rossetti E, PA-C :Gentle stretching and light ROM, no full overhead motion, and no lifting anything greater than 10-15 pounds for 6 week. (07/20/22)  ACTIVITY LEVEL / LEISURE: No specific exercise except she regularly walks her very active dog who is part [place].     OBJECTIVE:   PATIENT SURVEYS:  QUICK DASH: 2.27  OBSERVATIONS: Cording present right axilla   POSTURE:  Rounded shoulder posture   LYMPHEDEMA ASSESSMENT:   UPPER EXTREMITY AROM/PROM:   A/PROM RIGHT   eval   07/01/2022  Shoulder extension 50 75  Shoulder flexion 148 165  Shoulder  abduction 175 170  Shoulder internal rotation 63 80  Shoulder external rotation 90 95                          (Blank rows = not tested)   A/PROM LEFT   eval 07/01/2022  Shoulder extension 86 65  Shoulder flexion 153 172  Shoulder abduction 177 178  Shoulder internal rotation 57 85  Shoulder external rotation 87 90                          (Blank rows = not tested)     CERVICAL AROM: All within normal limits   UPPER EXTREMITY STRENGTH: WNL    LYMPHEDEMA ASSESSMENTS:    LANDMARK RIGHT   eval 07/01/2022  10 cm proximal to olecranon process 28.4 27.5  Olecranon process 24.2 22.  10 cm proximal to ulnar styloid process 21.2 20.5  Just proximal to ulnar styloid process 14.6 14.4  Across hand at thumb web space 17.5 18  At base of 2nd digit 6.2 5.5  (Blank rows = not tested)   LANDMARK LEFT   eval  10 cm proximal to olecranon process 27.5  Olecranon process 24.2  10 cm proximal to ulnar styloid process 20  Just proximal to ulnar styloid process 14.7  Across hand at thumb web space 17.5  At base of 2nd digit 6.2  (Blank rows = not tested)  Surgery type/Date: Right Breast Lumpectomy; right axillary sentinel lymph node dissection, Number of lymph nodes removed: 2 Current/past treatment (chemo, radiation, hormone therapy): chemotherapy, radiation , +/- antiestrogens Other symptoms:  Heaviness/tightness No Pain No Pitting edema No Infections No  TODAY"S TREATMENT 07/22/22 Manual: Cording release in supine to the Rt UE: axilla to lower medial arm with and without cocoa butter - using sustained pressure and STM PROM with axillary pinning into flexion and D2 Axillary circles and upper arm MLD post MFR  07/17/22 Manual: Cording release in supine to the Rt UE: axilla to lower medial arm with and without cocoa butter - using sustained pressure and STM PROM with axillary pinning into flexion and D2  Axillary circles and upper arm MLD post MFR TE: Printed out supine scap and  gave pt yellow band with demonstration and performance without band by patient per below (no flexion), discussed current stretches.   PATIENT EDUCATION:  Education details: ABCClass, Scar massage, compression garment, HEP, walking program, posture, supine scap, cording Person educated: Patient Education method: Explanation, Demonstration, and Handouts Education comprehension: verbalized understanding  HOME EXERCISE PROGRAM: Reviewed previously given post op HEP.  Access Code: [email or web address] URL: [email or web address] Date: 07/17/2022 Prepared by: Shan Levans  Exercises - Supine Shoulder Horizontal Abduction with Resistance  - 1 x daily - 3 x weekly - 1-3 sets - 10 reps - Supine Shoulder External Rotation with Resistance  - 1 x daily - 3 x weekly - 1-3 sets - 10 reps - Supine PNF D2 Flexion with Resistance  - 1 x daily - 3 x weekly - 1-3 sets - 10 reps  ASSESSMENT:  CLINICAL IMPRESSION:  Pt has 1 cord noted in the axilla into the upper medial arm.  This cord is present but not limiting any motion.  Will attempt more TE next visit.  Pt will benefit from skilled therapeutic intervention to improve on the following deficits: Decreased knowledge of precautions, impaired UE functional use, pain, decreased ROM, postural dysfunction.   PT treatment/interventions: ADL/Self care home management, Therapeutic exercises, Therapeutic activity, Neuromuscular re-education, Balance training, Gait training, Patient/Family education, Self Care, Joint mobilization, Dry Needling, Cryotherapy, Moist heat, Manual lymph drainage, Compression bandaging, scar mobilization, Taping, and Manual therapy   GOALS: Goals reviewed with patient? Yes  LONG TERM GOALS:  (STG=LTG)  GOALS Name Target Date  Goal status  1 Pt will demonstrate she has regained full shoulder ROM and function post operatively compared to baselines.  Baseline: 07/01/2022 MET  2  Pt will report 75% less discomfort with shoulder ROM  from the right axillary cording  08/19/2022 INITIAL  3 Pt will be independent with advanced HEP to maintain improvements made throughout therapy  08/19/2022 INITIAL     PLAN:  PT FREQUENCY/DURATION: 2 x 6 weeks   PLAN FOR NEXT SESSION: Manual stretching and STM to cording, and gentle ROM, start more stretches and AAROM    Shan Levans, PT  07/22/2022  10:56 AM

## 2022-07-24 ENCOUNTER — Encounter: Payer: Self-pay | Admitting: Rehabilitation

## 2022-07-24 ENCOUNTER — Ambulatory Visit: Admitting: Rehabilitation

## 2022-07-24 DIAGNOSIS — Z170 Estrogen receptor positive status [ER+]: Secondary | ICD-10-CM

## 2022-07-24 DIAGNOSIS — C50411 Malignant neoplasm of upper-outer quadrant of right female breast: Secondary | ICD-10-CM | POA: Diagnosis not present

## 2022-07-24 DIAGNOSIS — R293 Abnormal posture: Secondary | ICD-10-CM

## 2022-07-24 DIAGNOSIS — Z483 Aftercare following surgery for neoplasm: Secondary | ICD-10-CM

## 2022-07-24 DIAGNOSIS — M25611 Stiffness of right shoulder, not elsewhere classified: Secondary | ICD-10-CM

## 2022-07-24 NOTE — Therapy (Signed)
OUTPATIENT PHYSICAL THERAPY BREAST CANCER TREATMENT   Patient Name: Colleen Montgomery MRN: [id number] DOB:[birth year], 56 y.o., female Today's Date: 07/24/2022   PT End of Session - 07/24/22 0808     Visit Number 5    Number of Visits 12    Date for PT Re-Evaluation 08/19/22    PT Start Time 0808   arrived late   PT Stop Time 0850    PT Time Calculation (min) 42 min    Activity Tolerance Patient tolerated treatment well    Behavior During Therapy [facility] for tasks assessed/performed               Past Medical History:  Diagnosis Date   Breast cancer ([place])    Diverticulosis    Hypothyroidism    Thyroid disease    Past Surgical History:  Procedure Laterality Date   APPENDECTOMY     BREAST LUMPECTOMY Right 06/08/2022   Procedure: RIGHT BREAST LUMPECTOMY;  Surgeon: Coralie Keens, MD;  Location: [place];  Service: General;  Laterality: Right;   BREAST REDUCTION SURGERY Bilateral 06/08/2022   Procedure: BILATERAL ONCOPLASTIC BREAST REDUCTION;  Surgeon: [name], MD;  Location: [place];  Service: Plastics;  Laterality: Bilateral;   ECTOPIC PREGNANCY SURGERY  1996   RADIOACTIVE SEED GUIDED AXILLARY SENTINEL LYMPH NODE Right 06/08/2022   Procedure: RADIOACTIVE SEED GUIDED RIGHT AXILLARY SENTINEL LYMPH NODE DISSECTION;  Surgeon: Coralie Keens, MD;  Location: [place];  Service: General;  Laterality: Right;   Patient Active Problem List   Diagnosis Date Noted   Genetic testing 04/27/2022   Family history of breast cancer 04/16/2022   Family history of colon cancer 04/16/2022   Family history of prostate cancer in father 04/16/2022   Malignant neoplasm of upper-outer quadrant of right breast in female, estrogen receptor positive ([place]) 04/13/2022   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 Coralie Keens  REFERRING DIAG: Right breast cancer  THERAPY DIAG:  Malignant neoplasm of upper-outer quadrant of right breast in female, estrogen receptor positive ([place])  Abnormal posture  Stiffness of right shoulder, not elsewhere classified  Aftercare following surgery for neoplasm  Rationale for Evaluation and Treatment: Rehabilitation  ONSET DATE: 04/03/2022  SUBJECTIVE:  SUBJECTIVE STATEMENT:  I think there is a stitch popping out on the left side.  I am supposed to keep putting vaseline on the side and cover it with nonstick.      PERTINENT HISTORY:  Patient was diagnosed on 04/03/2022 with right 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 It measures 1.9 cm and is located in the upper outer quadrant. It is ER positive, PR negative and HER2 negative with a Ki67 of 60%. She has a positive axillary lymph node. Had a Rt lumpectomy with bil reduction on 06/08/22.  1/2 positive lymph nodes.   PATIENT GOALS:  Reassess how my recovery is going related to arm function, pain, and swelling.  PAIN:  Are you having pain? No  PRECAUTIONS: Recent Surgery, right UE Lymphedema risk, From Donnamarie Rossetti E, PA-C :Gentle stretching and light ROM, no full overhead motion, and no lifting anything greater than 10-15 pounds for 6 week. (07/20/22)  ACTIVITY LEVEL / LEISURE: No specific exercise except she regularly walks her very active dog who is part [place].     OBJECTIVE:   PATIENT SURVEYS:  QUICK DASH: 2.27  OBSERVATIONS: Cording present right axilla   POSTURE:  Rounded shoulder posture   LYMPHEDEMA ASSESSMENT:   UPPER EXTREMITY AROM/PROM:   A/PROM RIGHT    eval   07/01/2022  Shoulder extension 50 75  Shoulder flexion 148 165  Shoulder abduction 175 170  Shoulder internal rotation 63 80  Shoulder external rotation 90 95                          (Blank rows = not tested)   A/PROM LEFT   eval 07/01/2022  Shoulder extension 86 65  Shoulder flexion 153 172  Shoulder abduction 177 178  Shoulder internal rotation 57 85  Shoulder external rotation 87 90                          (Blank rows = not tested)     CERVICAL AROM: All within normal limits   UPPER EXTREMITY STRENGTH: WNL    LYMPHEDEMA ASSESSMENTS:    LANDMARK RIGHT   eval 07/01/2022  10 cm proximal to olecranon process 28.4 27.5  Olecranon process 24.2 22.  10 cm proximal to ulnar styloid process 21.2 20.5  Just proximal to ulnar styloid process 14.6 14.4  Across hand at thumb web space 17.5 18  At base of 2nd digit 6.2 5.5  (Blank rows = not tested)   LANDMARK LEFT   eval  10 cm proximal to olecranon process 27.5  Olecranon process 24.2  10 cm proximal to ulnar styloid process 20  Just proximal to ulnar styloid process 14.7  Across hand at thumb web space 17.5  At base of 2nd digit 6.2  (Blank rows = not tested)  Surgery type/Date: Right Breast Lumpectomy; right axillary sentinel lymph node dissection, Number of lymph nodes removed: 2 Current/past treatment (chemo, radiation, hormone therapy): chemotherapy, radiation , +/- antiestrogens Other symptoms:  Heaviness/tightness No Pain No Pitting edema No Infections No  TODAY"S TREATMENT 07/24/22 Manual: Cording release in supine to the Rt UE: axilla to lower medial arm with and without cocoa butter - using sustained pressure and STM PROM with axillary pinning into flexion and D2 Axillary circles and upper arm MLD post MFR Pt has a staple appearing in the center of the T junction open incision on the left and one long stitch also  coming out.  She will call MD office.  Held on TE due to this.   07/22/22 Manual:  Cording release in supine to the Rt UE: axilla to lower medial arm with and without cocoa butter - using sustained pressure and STM PROM with axillary pinning into flexion and D2 Axillary circles and upper arm MLD post MFR  07/17/22 Manual: Cording release in supine to the Rt UE: axilla to lower medial arm with and without cocoa butter - using sustained pressure and STM PROM with axillary pinning into flexion and D2 Axillary circles and upper arm MLD post MFR TE: Printed out supine scap and gave pt yellow band with demonstration and performance without band by patient per below (no flexion), discussed current stretches.   PATIENT EDUCATION:  Education details: ABCClass, Scar massage, compression garment, HEP, walking program, posture, supine scap, cording Person educated: Patient Education method: Explanation, Demonstration, and Handouts Education comprehension: verbalized understanding  HOME EXERCISE PROGRAM: Reviewed previously given post op HEP.  Access Code: [email or web address] URL: [email or web address] Date: 07/17/2022 Prepared by: Kara Tevis  Exercises - Supine Shoulder Horizontal Abduction with Resistance  - 1 x daily - 3 x weekly - 1-3 sets - 10 reps - Supine Shoulder External Rotation with Resistance  - 1 x daily - 3 x weekly - 1-3 sets - 10 reps - Supine PNF D2 Flexion with Resistance  - 1 x daily - 3 x weekly - 1-3 sets - 10 reps  ASSESSMENT:  CLINICAL IMPRESSION:  Pt has 1 cord noted in the axilla into the upper medial arm.  This cord is present but not limiting any motion.  Will attempt more TE next visit.  Pt will benefit from skilled therapeutic intervention to improve on the following deficits: Decreased knowledge of precautions, impaired UE functional use, pain, decreased ROM, postural dysfunction.   PT treatment/interventions: ADL/Self care home management, Therapeutic exercises, Therapeutic activity, Neuromuscular re-education, Balance training, Gait  training, Patient/Family education, Self Care, Joint mobilization, Dry Needling, Cryotherapy, Moist heat, Manual lymph drainage, Compression bandaging, scar mobilization, Taping, and Manual therapy   GOALS: Goals reviewed with patient? Yes  LONG TERM GOALS:  (STG=LTG)  GOALS Name Target Date  Goal status  1 Pt will demonstrate she has regained full shoulder ROM and function post operatively compared to baselines.  Baseline: 07/01/2022 MET  2  Pt will report 75% less discomfort with shoulder ROM from the right axillary cording  08/19/2022 INITIAL  3 Pt will be independent with advanced HEP to maintain improvements made throughout therapy  08/19/2022 INITIAL     PLAN:  PT FREQUENCY/DURATION: 2 x 6 weeks   PLAN FOR NEXT SESSION: Manual stretching and STM to cording, and gentle ROM, start more stretches and AAROM    Kara Tevis, PT  07/24/2022  8:55 AM   

## 2022-07-27 ENCOUNTER — Ambulatory Visit: Admitting: Radiation Oncology

## 2022-07-27 ENCOUNTER — Encounter: Payer: Self-pay | Admitting: *Deleted

## 2022-07-28 ENCOUNTER — Ambulatory Visit

## 2022-07-28 ENCOUNTER — Encounter: Admitting: Physician Assistant

## 2022-07-29 ENCOUNTER — Encounter: Payer: Self-pay | Admitting: *Deleted

## 2022-07-29 ENCOUNTER — Ambulatory Visit

## 2022-07-29 ENCOUNTER — Encounter: Admitting: Physician Assistant

## 2022-07-29 ENCOUNTER — Encounter: Admitting: Rehabilitation

## 2022-07-29 ENCOUNTER — Encounter: Admitting: Student

## 2022-07-30 ENCOUNTER — Ambulatory Visit

## 2022-07-30 NOTE — Progress Notes (Signed)
Patient is a 56 year old female with PMH of right-sided breast cancer s/p right-sided lumpectomy and lymph node dissection as well as bilateral oncoplastic reduction by Dr. Erin Hearing 06/08/2022 who presents for postoperative follow-up.  She was last seen here in clinic on 07/22/2022.  At that time, exam was largely reassuring.  There were small superficial wounds noted along medial aspect of left NAC as well as superior aspect vertical limb and inferior T zone.  Some surrounding irritation.  Lower suspicion for infection.  Recommended Vaseline daily.  Nonstick over lying dressing.  Follow-up next week.  Today, patient is doing okay.  She states that unfortunately she has had issues with protruding sutures and staples that have led to inflammation and blistering of her incisions.  This has been waxing and waning throughout her postoperative recovery.  Likely attributable to her INSORB versus V-Loc 90 sutures.  She states that she is not having any pain or systemic symptoms.  Overall she is pleased, she simply is hoping that she can finally heal and put her postoperative recovery behind her.   On exam, she does have a few scattered incisional wounds.  On the right side, she has a wound at the 1 o'clock position, mild amount of drainage expressed.  Nonpurulent.  On the left side, she does have a 2 x 1 cm incisional wound at the inferior T zone.  V-Loc suture is protruding at that site, approximately 2 cm long, is removed without complication or difficulty.  Mild bleeding, but stopped quickly with compression.  She has an additional 1 x 1 cm wound at the superior aspect of vertical limb, nondraining.  Discussed Vaseline and an overlying nonstick gauze versus trying Medihoney alginate.  She is agreeable to try the Medihoney alginate.  Will have her follow-up next week for a telephone encounter.  If she feels as though it is helping, will provide her with a written order to [facility] for additional Medihoney alginate  dressings for her incisional wounds.  No evidence concerning for infection on exam.  Dr. Marla Roe evaluated patient and agrees with assessment and plan.  Picture(s) obtained of the patient and placed in the chart were with the patient's or guardian's permission.

## 2022-07-31 ENCOUNTER — Ambulatory Visit (INDEPENDENT_AMBULATORY_CARE_PROVIDER_SITE_OTHER): Admitting: Physician Assistant

## 2022-07-31 ENCOUNTER — Ambulatory Visit

## 2022-07-31 ENCOUNTER — Encounter: Admitting: Rehabilitation

## 2022-07-31 VITALS — BP 129/78 | HR 97

## 2022-07-31 DIAGNOSIS — Z9889 Other specified postprocedural states: Secondary | ICD-10-CM

## 2022-08-03 ENCOUNTER — Ambulatory Visit

## 2022-08-03 ENCOUNTER — Encounter: Payer: Self-pay | Admitting: *Deleted

## 2022-08-04 ENCOUNTER — Ambulatory Visit

## 2022-08-05 ENCOUNTER — Encounter: Payer: Self-pay | Admitting: Rehabilitation

## 2022-08-05 ENCOUNTER — Ambulatory Visit

## 2022-08-05 ENCOUNTER — Ambulatory Visit: Admitting: Rehabilitation

## 2022-08-05 DIAGNOSIS — R293 Abnormal posture: Secondary | ICD-10-CM

## 2022-08-05 DIAGNOSIS — C50411 Malignant neoplasm of upper-outer quadrant of right female breast: Secondary | ICD-10-CM | POA: Diagnosis not present

## 2022-08-05 DIAGNOSIS — Z483 Aftercare following surgery for neoplasm: Secondary | ICD-10-CM

## 2022-08-05 DIAGNOSIS — Z170 Estrogen receptor positive status [ER+]: Secondary | ICD-10-CM

## 2022-08-05 DIAGNOSIS — M25611 Stiffness of right shoulder, not elsewhere classified: Secondary | ICD-10-CM

## 2022-08-05 NOTE — Therapy (Signed)
OUTPATIENT PHYSICAL THERAPY BREAST CANCER TREATMENT   Patient Name: Colleen Montgomery MRN: [id number] DOB:Feb 05, 1966, 56 y.o., female Today's Date: 08/05/2022   PT End of Session - 08/05/22 0808     Visit Number 6    Number of Visits 12    Date for PT Re-Evaluation 08/19/22    PT Start Time 0810   late   PT Stop Time 0845    PT Time Calculation (min) 35 min    Activity Tolerance Patient tolerated treatment well    Behavior During Therapy [facility] for tasks assessed/performed               Past Medical History:  Diagnosis Date   Breast cancer ([place])    Diverticulosis    Hypothyroidism    Thyroid disease    Past Surgical History:  Procedure Laterality Date   APPENDECTOMY     BREAST LUMPECTOMY Right 06/08/2022   Procedure: RIGHT BREAST LUMPECTOMY;  Surgeon: Coralie Keens, MD;  Location: [place];  Service: General;  Laterality: Right;   BREAST REDUCTION SURGERY Bilateral 06/08/2022   Procedure: BILATERAL ONCOPLASTIC BREAST REDUCTION;  Surgeon: [name], MD;  Location: [place];  Service: Plastics;  Laterality: Bilateral;   ECTOPIC PREGNANCY SURGERY  1996   RADIOACTIVE SEED GUIDED AXILLARY SENTINEL LYMPH NODE Right 06/08/2022   Procedure: RADIOACTIVE SEED GUIDED RIGHT AXILLARY SENTINEL LYMPH NODE DISSECTION;  Surgeon: Coralie Keens, MD;  Location: [place];  Service: General;  Laterality: Right;   Patient Active Problem List   Diagnosis Date Noted   Genetic testing 04/27/2022   Family history of breast cancer 04/16/2022   Family history of colon cancer 04/16/2022   Family history of prostate cancer in father 04/16/2022   Malignant neoplasm of upper-outer quadrant of right breast in female, estrogen receptor positive ([place]) 04/13/2022   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 Coralie Keens  REFERRING DIAG: Right breast cancer  THERAPY DIAG:  Malignant neoplasm of upper-outer quadrant of right breast in female, estrogen receptor positive ([place])  Stiffness of right shoulder, not elsewhere classified  Abnormal posture  Aftercare following surgery for neoplasm  Rationale for Evaluation and Treatment: Rehabilitation  ONSET DATE: 04/03/2022  SUBJECTIVE:  SUBJECTIVE STATEMENT:  They cut the stitch.  I met with Dr. Marla Roe.  I am supposed to use medihoney.  Otherwise looking good.     I'm not feeling the pull anymore.  PERTINENT HISTORY:  Patient was diagnosed on 04/03/2022 with right 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 It measures 1.9 cm and is located in the upper outer quadrant. It is ER positive, PR negative and HER2 negative with a Ki67 of 60%. She has a positive axillary lymph node. Had a Rt lumpectomy with bil reduction on 06/08/22.  1/2 positive lymph nodes.   PATIENT GOALS:  Reassess how my recovery is going related to arm function, pain, and swelling.  PAIN:  Are you having pain? No  PRECAUTIONS: Recent Surgery, right UE Lymphedema risk, From Donnamarie Rossetti E, PA-C :Gentle stretching and light ROM, no full overhead motion, and no lifting anything greater than 10-15 pounds for 6 week. (07/20/22)  ACTIVITY LEVEL / LEISURE: No specific exercise except she regularly walks her very active dog who is part [place].     OBJECTIVE:   PATIENT SURVEYS:  QUICK DASH: 2.27  OBSERVATIONS: Cording present right axilla   POSTURE:  Rounded shoulder posture   LYMPHEDEMA ASSESSMENT:   UPPER EXTREMITY AROM/PROM:   A/PROM RIGHT   eval    07/01/2022  Shoulder extension 50 75  Shoulder flexion 148 165  Shoulder abduction 175 170  Shoulder internal rotation 63 80  Shoulder external rotation 90 95                          (Blank rows = not tested)   A/PROM LEFT   eval 07/01/2022  Shoulder extension 86 65  Shoulder flexion 153 172  Shoulder abduction 177 178  Shoulder internal rotation 57 85  Shoulder external rotation 87 90                          (Blank rows = not tested)     CERVICAL AROM: All within normal limits   UPPER EXTREMITY STRENGTH: WNL    LYMPHEDEMA ASSESSMENTS:    LANDMARK RIGHT   eval 07/01/2022  10 cm proximal to olecranon process 28.4 27.5  Olecranon process 24.2 22.  10 cm proximal to ulnar styloid process 21.2 20.5  Just proximal to ulnar styloid process 14.6 14.4  Across hand at thumb web space 17.5 18  At base of 2nd digit 6.2 5.5  (Blank rows = not tested)   LANDMARK LEFT   eval  10 cm proximal to olecranon process 27.5  Olecranon process 24.2  10 cm proximal to ulnar styloid process 20  Just proximal to ulnar styloid process 14.7  Across hand at thumb web space 17.5  At base of 2nd digit 6.2  (Blank rows = not tested)  Surgery type/Date: Right Breast Lumpectomy; right axillary sentinel lymph node dissection, Number of lymph nodes removed: 2 Current/past treatment (chemo, radiation, hormone therapy): chemotherapy, radiation , +/- antiestrogens Other symptoms:  Heaviness/tightness No Pain No Pitting edema No Infections No  TODAY"S TREATMENT 08/05/22 TE: Attempting pulleys but these are easy.   Standing yellow band TE: row, extension, horizontal abduction, bil ER, diagonals each x 10 with intial instruction and addition to HEP (medbridge not working so pt given EPIC version with drawings.  Manual: Checked Rt UE which does have 1 small cord palpable but not taut.  PROM into all directions.   Goal check  07/24/22 Manual: Cording release in supine to the Rt UE: axilla to lower  medial arm with and without cocoa butter - using sustained pressure and STM PROM with axillary pinning into flexion and D2 Axillary circles and upper arm MLD post MFR Pt has a staple appearing in the center of the T junction open incision on the left and one long stitch also coming out.  She will call MD office.  Held on TE due to this.   07/22/22 Manual: Cording release in supine to the Rt UE: axilla to lower medial arm with and without cocoa butter - using sustained pressure and STM PROM with axillary pinning into flexion and D2 Axillary circles and upper arm MLD post MFR  07/17/22 Manual: Cording release in supine to the Rt UE: axilla to lower medial arm with and without cocoa butter - using sustained pressure and STM PROM with axillary pinning into flexion and D2 Axillary circles and upper arm MLD post MFR TE: Printed out supine scap and gave pt yellow band with demonstration and performance without band by patient per below (no flexion), discussed current stretches.   PATIENT EDUCATION:  Education details: ABCClass, Scar massage, compression garment, HEP, walking program, posture, supine scap, cording Person educated: Patient Education method: Explanation, Demonstration, and Handouts Education comprehension: verbalized understanding  HOME EXERCISE PROGRAM: Reviewed previously given post op HEP.  Access Code: [email or web address] URL: [email or web address] Date: 07/17/2022 Prepared by: Shan Levans  Exercises - Supine Shoulder Horizontal Abduction with Resistance  - 1 x daily - 3 x weekly - 1-3 sets - 10 reps - Supine Shoulder External Rotation with Resistance  - 1 x daily - 3 x weekly - 1-3 sets - 10 reps - Supine PNF D2 Flexion with Resistance  - 1 x daily - 3 x weekly - 1-3 sets - 10 reps  ASSESSMENT:  CLINICAL IMPRESSION:  Pt is ready for DC with SOZO continued only.   Pt will benefit from skilled therapeutic intervention to improve on the following deficits: Decreased  knowledge of precautions, impaired UE functional use, pain, decreased ROM, postural dysfunction.   PT treatment/interventions: ADL/Self care home management, Therapeutic exercises, Therapeutic activity, Neuromuscular re-education, Balance training, Gait training, Patient/Family education, Self Care, Joint mobilization, Dry Needling, Cryotherapy, Moist heat, Manual lymph drainage, Compression bandaging, scar mobilization, Taping, and Manual therapy   GOALS: Goals reviewed with patient? Yes  LONG TERM GOALS:  (STG=LTG)  GOALS Name Target Date  Goal status  1 Pt will demonstrate she has regained full shoulder ROM and function post operatively compared to baselines.  Baseline: 07/01/2022 MET  2  Pt will report 75% less discomfort with shoulder ROM from the right axillary cording  No pain currently 08/19/2022 MET  3 Pt will be independent with advanced HEP to maintain improvements made throughout therapy  08/19/2022 MET     PLAN:  PT FREQUENCY/DURATION: 2 x 6 weeks   PLAN FOR NEXT SESSION: Manual stretching and STM to cording, and gentle ROM, start more stretches and AAROM    Shan Levans, PT  08/05/2022  8:45 AM    PHYSICAL THERAPY DISCHARGE SUMMARY  Visits from Start of Care: 6  Current functional level related to goals / functional outcomes: See above   Remaining deficits: Lymphedema risk Rt   Education / Equipment: Final HEP  Plan: Patient agrees to discharge.  Patient is being discharged due to meeting the stated rehab goals.

## 2022-08-05 NOTE — Patient Instructions (Signed)

## 2022-08-06 ENCOUNTER — Ambulatory Visit

## 2022-08-06 NOTE — Progress Notes (Signed)
Patient is a 56 year old female with PMH of right-sided breast cancer s/p right-sided lumpectomy and lymph node dissection as well as bilateral oncoplastic reduction by Dr. Erin Hearing 06/08/2022 who presents for postoperative telephone encounter.  The patient, Colleen Montgomery, gave permission to have this encounter performed via telemedicine, two identifiers were used to confirm patient's identity.  They also consented to chart review and treatment via this encounter.  The patient was  at home and this provider was calling from their office.  A total of 15 minutes was spent speaking with the patient and reviewing chart.    He was last seen here in clinic on 07/31/2022.  At that time, she had a few scattered incisional wounds noted on exam.  V-Loc suture was protruding at the inferior T zone on left side which was removed without complication or difficulty.  She had additional wounds at the superior aspect of left vertical limb as well as periareolar wounds right side.  Discussed Medihoney alginate.  Plan is for follow-up telephone encounter.  If she felt improved with the Medihoney alginate, good facilitate continued dressings through prism.  Otherwise, could consider simple Vaseline gauze.  Today, patient states that she is doing okay.  She reports that the wounds had not been draining enough to make the Medihoney alginate worthwhile.  Instead, she went out and purchased Medihoney gel from the pharmacy and has been applying that 1-2 times daily.  She feels as though she has a good wound care regimen at home and has noticed some improvement.  She also recently completed PT.  She will occasionally have sharp sensations with certain movements, otherwise doing well from pain standpoint.  Suspect that her occasional pains are related to undissolved INSORB staples and V-Loc sutures that are pulling in her skin, comparable to the one that was removed in clinic last week.  Suspect that it will resolve once they completely  break down which should be in the next 1 to 2 months.  She states that she has had some improvement with regard to her wound healing, suspect that it will continue with current management.  She has an in person evaluation scheduled for next week here in clinic.  Will obtain photos at that time and reassess.

## 2022-08-07 ENCOUNTER — Encounter

## 2022-08-07 ENCOUNTER — Ambulatory Visit

## 2022-08-07 ENCOUNTER — Ambulatory Visit (INDEPENDENT_AMBULATORY_CARE_PROVIDER_SITE_OTHER): Admitting: Physician Assistant

## 2022-08-07 DIAGNOSIS — Z9889 Other specified postprocedural states: Secondary | ICD-10-CM

## 2022-08-11 ENCOUNTER — Ambulatory Visit

## 2022-08-12 ENCOUNTER — Encounter: Admitting: Rehabilitation

## 2022-08-12 ENCOUNTER — Ambulatory Visit

## 2022-08-13 ENCOUNTER — Encounter: Admitting: Student

## 2022-08-13 ENCOUNTER — Ambulatory Visit

## 2022-08-13 NOTE — Progress Notes (Deleted)
Patient is a 56 year old female who underwent right lumpectomy and lymph node dissection as well as bilateral oncoplastic reduction by Dr. Erin Hearing on 06/08/2022.  She presents to the clinic today for postoperative follow-up.  Patient was last seen in the clinic on 07/31/2022.  At this visit, patient was found to have a few scattered incisional wounds.  On the right side, she had a wound at the 1 o'clock position with a mild amount of drainage expressed.  On the left, she had a 2 x 1 incisional wound at the inferior T-zone.  There is a V-Loc suture protruding at the site.  This was removed.  She had an additional 1 x 1 cm wound at the superior aspect of the vertical limb.  Plan was for patient to apply Medihoney alginate to her wounds and follow-up via telephone encounter to see if she felt that it was helping.    Patient then followed up via telephone encounter on 08/07/2022.  At this visit, patient reported she was doing okay.  She reported her wounds had not been draining enough to make the Medihoney alginate worthwhile, but she instead went out and purchased Medihoney gel from the pharmacy which she had applying 1-2 times per day.  She felt that she had noticed some improvement in her wounds with this.  Today,

## 2022-08-14 ENCOUNTER — Encounter: Admitting: Rehabilitation

## 2022-08-14 ENCOUNTER — Encounter: Payer: Self-pay | Admitting: *Deleted

## 2022-08-14 ENCOUNTER — Ambulatory Visit (INDEPENDENT_AMBULATORY_CARE_PROVIDER_SITE_OTHER): Admitting: Physician Assistant

## 2022-08-14 ENCOUNTER — Ambulatory Visit

## 2022-08-14 DIAGNOSIS — Z9889 Other specified postprocedural states: Secondary | ICD-10-CM

## 2022-08-14 NOTE — Progress Notes (Signed)
This is a 56 year old female with past medical history of right-sided breast cancer status post right sided lumpectomy and lymph node dissection as well as bilateral oncoplastic reduction by Dr. Ginette Otto on 06/08/2022 who presents for postoperative follow-up.  The patient was most recently seen on 08/07/2022.  This was a telemetry visit.  She had some ongoing wounds to the bilateral breast.  Since her last visit she notes she has been doing well, she notes some continued wounds along the bilateral breast which have improved.  She has been using Medihoney gel from the pharmacy and applying that daily.  She denies any infectious signs or symptoms.  Chaperone present on exam the right breast has a wound at the 1 o'clock position of the outer areola, this is superficial with no drainage, the left breast has a superficial wound at the 1 o'clock position as well as the inferior T-zone, again this is superficial with no significant drainage, no surrounding redness warmth or discharge.  Overall the patient is doing well, her wounds continue to improve.  She has no infectious signs or symptoms.  She will continue using Medihoney, we will see her back in our office in approximately 1 month for repeat evaluation or sooner as needed.  Also the patient did discuss her interest in pursuing halo laser, we will schedule her a visit for this.  The patient was given strict return precautions, she verbalized understanding and agreement to today's plan had no further questions or concerns.

## 2022-08-17 ENCOUNTER — Ambulatory Visit

## 2022-08-17 DIAGNOSIS — C50411 Malignant neoplasm of upper-outer quadrant of right female breast: Secondary | ICD-10-CM | POA: Insufficient documentation

## 2022-08-17 DIAGNOSIS — Z510 Encounter for antineoplastic radiation therapy: Secondary | ICD-10-CM | POA: Insufficient documentation

## 2022-08-18 ENCOUNTER — Other Ambulatory Visit: Payer: Self-pay | Admitting: Surgical

## 2022-08-18 ENCOUNTER — Ambulatory Visit

## 2022-08-18 ENCOUNTER — Other Ambulatory Visit (HOSPITAL_COMMUNITY): Payer: Self-pay

## 2022-08-18 MED ORDER — LIDOCAINE 23% - TETRACAINE 7% TOPICAL OINTMENT (PLASTICIZED)
1.0000 | TOPICAL_OINTMENT | Freq: Once | CUTANEOUS | 0 refills | Status: AC
  Filled 2022-08-18: qty 60, 1d supply, fill #0

## 2022-08-18 NOTE — Progress Notes (Signed)
Rx for Halo laser numbing

## 2022-08-19 ENCOUNTER — Ambulatory Visit

## 2022-08-19 ENCOUNTER — Encounter: Admitting: Rehabilitation

## 2022-08-20 ENCOUNTER — Ambulatory Visit

## 2022-08-21 ENCOUNTER — Encounter: Admitting: Rehabilitation

## 2022-08-21 ENCOUNTER — Ambulatory Visit

## 2022-08-24 ENCOUNTER — Ambulatory Visit

## 2022-08-25 ENCOUNTER — Encounter: Payer: Self-pay | Admitting: *Deleted

## 2022-08-25 ENCOUNTER — Ambulatory Visit

## 2022-08-25 ENCOUNTER — Telehealth: Payer: Self-pay | Admitting: Radiation Oncology

## 2022-08-25 ENCOUNTER — Other Ambulatory Visit: Payer: Self-pay | Admitting: *Deleted

## 2022-08-25 DIAGNOSIS — Z170 Estrogen receptor positive status [ER+]: Secondary | ICD-10-CM

## 2022-08-25 NOTE — Telephone Encounter (Signed)
Left message to call back to schedule consult per 1/9 referral.

## 2022-08-26 ENCOUNTER — Ambulatory Visit

## 2022-08-27 ENCOUNTER — Telehealth: Payer: Self-pay | Admitting: Radiation Oncology

## 2022-08-27 ENCOUNTER — Ambulatory Visit

## 2022-08-27 NOTE — Progress Notes (Incomplete)
Location of Breast Cancer:  Malignant neoplasm of upper-outer quadrant of right breast in female, estrogen receptor positive  Histology per Pathology Report:  06/08/2022 A. BREAST, RIGHT, LUMPECTOMY:  - Invasive ductal carcinoma, 5.5 cm, grade 3  - Resection margins are negative for carcinoma; closest is the posterior margin and 0.2 cm  - Biopsy site changes  - See oncology table  B. 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 BREAST, RIGHT, MAMMOPLASTY:  - Benign breast parenchyma, negative for carcinoma  D. BREAST, LEFT, MAMMOPLASTY:  - Benign breast parenchyma, negative for carcinoma   Receptor Status: ER(60%), PR (0%), Her2-neu (Negative via [place]), Ki-67(60%)  Past/Anticipated interventions by surgeon, if any:  06/08/2022 --Dr. Coralie Keens RIGHT BREAST RADIOACTIVE SEED GUIDED LUMPECTOMY RIGHT DEEP AXILLARY RADIOACTIVE SEED GUIDED TARGETED LYMPH NODE DISSECTION AND --Dr. [name] (plastic surgeon) Bilateral oncoplastic breast reduction with superomedial pedicle   Past/Anticipated interventions by medical oncology, if any:  Under care of Dr. [name] 06/22/2022 --Treatment plan: Neoadjuvant chemotherapy with Taxol Doristine Church followed by Adriamycin Cytoxan Keytruda followed by Beryle Flock maintenance for 1 year. Breast conserving surgery with targeted node dissection Adjuvant radiation +/- Antiestrogens (based upon final pathology) --Patient wanted to pursue holistic treatment options, and not interested in adjuvant chemotherapy.  --Return to clinic after radiation is complete   Lymphedema issues, if any:  ***    Pain issues, if any:  ***   SAFETY ISSUES: Prior radiation? No Pacemaker/ICD? No Possible current pregnancy? No--postmenopausal  Is the patient on methotrexate? No  Current Complaints / other details:  ***

## 2022-08-27 NOTE — Telephone Encounter (Signed)
Left message proposing 2:00 PM time for 1/15 appointment instead of 12:30 PM per Dr. Sondra Come.

## 2022-08-27 NOTE — Telephone Encounter (Signed)
LVM 1/9 '@1'$ :35 PM, 1/10 '@10'$ :12 AM, and 1/11 @ 10:37 AM to call back to schedule consult per 1/9 referral.

## 2022-08-28 ENCOUNTER — Ambulatory Visit

## 2022-08-30 NOTE — Progress Notes (Signed)
Radiation Oncology         [phone] ________________________________  Name: Colleen Montgomery MRN: [id number]  Date: 08/31/2022  DOB: [birth year]  Re-Evaluation Note  CC: Colleen Pepper, MD  Colleen Lose, MD  No diagnosis found.  Diagnosis: Stage IIB (cT1c, cN1, cM0) Right Breast UOQ, Invasive Ductal Carcinoma, ER- / PR- / Her2-, Grade 3, s/p lumpectomy with clean margins and 1 positive axillary LN. She has declined chemotherapy (pursuing holistic treatment)    Narrative 08/31/22:  The patient returns today for further discussion of radiation therapy in management of her diagnosed right breast cancer. I last met with the patient in consultation on 07/02/22. To review, I recommended that she have 28 fractions of radiation to the right breast and axilla region to decrease the risk of disease recurrence. We reviewed the logistics, risks, benefits, and potential side effects of treatment and the patient consented to pursue treatment. She presented for CT-sim on 12/04, and we discussed plans of initiating treatment approximately a week later to give her an adequate amount of time to heal from surgery.   Since she was seen in consultation, the patient followed up with Dr. Ninfa Montgomery on 07/31/22. Physical exam performed during this visit noted some slight erythema to the medial lumpectomy incision area. Exam also showed some mild breakdown along the inframammary ridge at both mastectomy sites, but these appeared to be healing well. She also endorsed some cording in the right axilla and small seroma. The seroma appeared to have significantly improved on exam with compression bra use, as well as physical therapy. Dr. Ninfa Montgomery notes that she is applying Vaseline to the erythematous areas.         The patient also continues to follow with plastic surgery. Per her most recent visit with plastics on 08/14/22, the patient continues to meet post-operative milestones. She has been applying Medihoney to the  incision scars daily which seems to work well for her.   Narrative Re-Consultation 07/02/22:  The patient returns today to discuss radiation treatment options. She was seen in the multidisciplinary breast clinic/consultation on 04/15/22.    On the date of her consultation, she opted to pursue genetic testing. Results showed no clinically significant variants detected by BRCAplus  or+RNAinsight testing. 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    Oncotype DX was obtained from her initial biopsy sample and the recurrence score of 51 predicted a risk of recurrence outside the breast over the next 9 years of >42%, if the patient's only systemic therapy is an antiestrogen for 5 years.  It also predicted a significant benefit from chemotherapy.   The patient accordingly followed up with Dr. Lindi Montgomery to discuss systemic treatment options. However, the patient ultimately opted against chemotherapy and decided to proceed with surgery and holistic treatment in some form. Her holistic doctor performed onconomics analysis on circulating tumor cells to determine drug sensitivity (report located in media tab dated 06/23/22).    She opted to proceed with right breast lumpectomy and nodal biopsies on 06/08/22 under the care of Dr. Ninfa Montgomery. Pathology from the procedure revealed grade 3 invasive ductal carcinoma measuring 5.5 cm. All margins negative for carcinoma. Nodal status of 1/2 right axillary lymph node excisions positive for metastatic carcinoma with focal extranodal extension (measuring 1.4 cm in the greatest dimension). Prognostic indicators significant for: estrogen receptor negative; progesterone receptor negative; Proliferation marker Ki67 at 50%; Her2 status negative; Grade 3.    She also underwent bilateral  breast reduction at the time of breast conserving surgery.     During her most recent follow-up visit with Dr. Lindi Montgomery on 06/22/22, the patient  was noted to he healing and recovering well from surgery. Given that prognostic indicators from her final path confirm triple negative disease, the patient will likely not undergo antiestrogen therapy following XRT.  Even though she is not pursuing chemotherapy, the patient will return to Dr. Lindi Montgomery following radiation for routine follow-up.    On review of systems, the patient reports to be doing well. She is healing well from surgery. She has been followed by plastic surgery. There were some concerns of an infection at the left breast incision site so she was placed on an antibiotic. This unfortunately caused a rash so she was taken off of it. Her sutures were taken out today and she was prescribed Doxycycline to use if an infection appears. She has not needed to take it. She states she had some chording from surgery but she continues to be seen by PT.  She denies chest pain, shortness of breath, lymphedema and any other symptoms.   On review of systems, the patient reports ***. She denies *** and any other symptoms.    Allergies:  is allergic to cephalexin.  Meds: Current Outpatient Medications  Medication Sig Dispense Refill   carboxymethylcellulose (REFRESH TEARS) 0.5 % SOLN Place 1 drop into both eyes daily.     thyroid (ARMOUR) 15 MG tablet Take 15 mg by mouth daily.     No current facility-administered medications for this encounter.    Physical Findings: The patient is in no acute distress. Patient is alert and oriented.  vitals were not taken for this visit.  No significant changes. Lungs are clear to auscultation bilaterally. Heart has regular rate and rhythm. No palpable cervical, supraclavicular, or axillary adenopathy. Abdomen soft, non-tender, normal bowel sounds. Left Breast: no palpable mass, nipple discharge or bleeding. Right Breast: ***  Lab Findings: Lab Results  Component Value Date   WBC 5.5 06/05/2022   HGB 12.4 06/05/2022   HCT 37.6 06/05/2022   MCV 87.4  06/05/2022   PLT 213 06/05/2022    Radiographic Findings: No results found.  Impression: Stage IIB (cT1c, cN1, cM0) Right Breast UOQ, Invasive Ductal Carcinoma, ER- / PR- / Her2-, Grade 3, s/p lumpectomy with clean margins and 1 positive axillary LN. She has declined chemotherapy (pursuing holistic treatment)    ***  Plan:  Patient is scheduled for CT simulation {date/later today}. ***  -----------------------------------  Blair Promise, PhD, MD  This document serves as a record of services personally performed by Gery Pray, MD. It was created on his behalf by Roney Mans, a trained medical scribe. The creation of this record is based on the scribe's personal observations and the provider's statements to them. This document has been checked and approved by the attending provider.

## 2022-08-31 ENCOUNTER — Ambulatory Visit
Admission: RE | Admit: 2022-08-31 | Discharge: 2022-08-31 | Disposition: A | Discharge status: Home / Self Care | Source: Ambulatory Visit | Attending: Radiation Oncology | Admitting: Radiation Oncology

## 2022-08-31 ENCOUNTER — Ambulatory Visit

## 2022-08-31 ENCOUNTER — Other Ambulatory Visit: Payer: Self-pay

## 2022-08-31 ENCOUNTER — Encounter: Payer: Self-pay | Admitting: Radiation Oncology

## 2022-08-31 VITALS — BP 122/75 | HR 91 | Temp 97.8°F | Resp 18 | Wt 163.4 lb

## 2022-08-31 DIAGNOSIS — C50411 Malignant neoplasm of upper-outer quadrant of right female breast: Secondary | ICD-10-CM

## 2022-08-31 DIAGNOSIS — Z170 Estrogen receptor positive status [ER+]: Secondary | ICD-10-CM

## 2022-08-31 DIAGNOSIS — Z510 Encounter for antineoplastic radiation therapy: Secondary | ICD-10-CM | POA: Diagnosis present

## 2022-08-31 NOTE — Progress Notes (Signed)
Location of Breast Cancer:  Malignant neoplasm of upper-outer quadrant of right breast in female, estrogen receptor positive  Histology per Pathology Report:  06/08/2022 A. BREAST, RIGHT, LUMPECTOMY:  - Invasive ductal carcinoma, 5.5 cm, grade 3  - Resection margins are negative for carcinoma; closest is the posterior margin and 0.2 cm  - Biopsy site changes  - See oncology table  B. 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 BREAST, RIGHT, MAMMOPLASTY:  - Benign breast parenchyma, negative for carcinoma  D. BREAST, LEFT, MAMMOPLASTY:  - Benign breast parenchyma, negative for carcinoma   Receptor Status: ER(60%), PR (0%), Her2-neu (Negative via [place]), Ki-67(60%)  Past/Anticipated interventions by surgeon, if any:  06/08/2022 --Dr. Coralie Keens RIGHT BREAST RADIOACTIVE SEED GUIDED LUMPECTOMY RIGHT DEEP AXILLARY RADIOACTIVE SEED GUIDED TARGETED LYMPH NODE DISSECTION AND --Dr. [name] (plastic surgeon) Bilateral oncoplastic breast reduction with superomedial pedicle   Past/Anticipated interventions by medical oncology, if any:  Under care of Dr. [name] 06/22/2022 --Treatment plan: Neoadjuvant chemotherapy with Taxol Doristine Church followed by Adriamycin Cytoxan Keytruda followed by Beryle Flock maintenance for 1 year. Breast conserving surgery with targeted node dissection Adjuvant radiation +/- Antiestrogens (based upon final pathology) --Patient wanted to pursue holistic treatment options, and not interested in adjuvant chemotherapy.  --Return to clinic after radiation is complete   Lymphedema issues, if any:  no    Pain issues, if any:  no   SAFETY ISSUES: Prior radiation? No Pacemaker/ICD? No Possible current pregnancy? No--postmenopausal  Is the patient on methotrexate? No  Current Complaints / other details:  Vitals:    08/31/22 1402  BP: 122/75  Pulse: 91  Resp: 18  Temp: 97.8 F (36.6 C)  SpO2: 100%  Weight: 74.1 kg

## 2022-09-01 ENCOUNTER — Ambulatory Visit

## 2022-09-01 ENCOUNTER — Ambulatory Visit: Admitting: Radiation Oncology

## 2022-09-02 ENCOUNTER — Ambulatory Visit

## 2022-09-03 ENCOUNTER — Ambulatory Visit

## 2022-09-03 ENCOUNTER — Encounter (HOSPITAL_COMMUNITY): Payer: Self-pay

## 2022-09-03 DIAGNOSIS — Z510 Encounter for antineoplastic radiation therapy: Secondary | ICD-10-CM | POA: Diagnosis not present

## 2022-09-04 ENCOUNTER — Ambulatory Visit

## 2022-09-04 ENCOUNTER — Encounter: Payer: Self-pay | Admitting: *Deleted

## 2022-09-07 ENCOUNTER — Telehealth: Payer: Self-pay | Admitting: Surgical

## 2022-09-07 ENCOUNTER — Ambulatory Visit

## 2022-09-07 NOTE — Telephone Encounter (Signed)
Called pt to discuss her Halo laser. She is schedule for February 5th, but I reviewed her chart and noted she had radiation beginning.   It is recommended we wait until completion of radiation and completion of her right breast cancer treatments before proceeding with any laser treatments.  If patient calls back, please notify me so I can discuss with pt.  No VM was left.

## 2022-09-08 ENCOUNTER — Ambulatory Visit

## 2022-09-08 ENCOUNTER — Ambulatory Visit: Admitting: Radiation Oncology

## 2022-09-09 ENCOUNTER — Ambulatory Visit

## 2022-09-09 ENCOUNTER — Other Ambulatory Visit: Payer: Self-pay

## 2022-09-09 ENCOUNTER — Ambulatory Visit: Attending: Surgery | Admitting: Rehabilitation

## 2022-09-09 ENCOUNTER — Ambulatory Visit
Admission: RE | Admit: 2022-09-09 | Discharge: 2022-09-09 | Disposition: A | Discharge status: Home / Self Care | Source: Ambulatory Visit | Attending: Radiation Oncology | Admitting: Radiation Oncology

## 2022-09-09 DIAGNOSIS — M25611 Stiffness of right shoulder, not elsewhere classified: Secondary | ICD-10-CM

## 2022-09-09 DIAGNOSIS — Z510 Encounter for antineoplastic radiation therapy: Secondary | ICD-10-CM | POA: Diagnosis not present

## 2022-09-09 DIAGNOSIS — Z170 Estrogen receptor positive status [ER+]: Secondary | ICD-10-CM | POA: Insufficient documentation

## 2022-09-09 DIAGNOSIS — C50411 Malignant neoplasm of upper-outer quadrant of right female breast: Secondary | ICD-10-CM | POA: Insufficient documentation

## 2022-09-09 DIAGNOSIS — R293 Abnormal posture: Secondary | ICD-10-CM | POA: Insufficient documentation

## 2022-09-09 DIAGNOSIS — Z483 Aftercare following surgery for neoplasm: Secondary | ICD-10-CM | POA: Insufficient documentation

## 2022-09-09 LAB — RAD ONC ARIA SESSION SUMMARY

## 2022-09-09 NOTE — Therapy (Signed)
  OUTPATIENT PHYSICAL THERAPY SOZO SCREENING NOTE   Patient Name: Colleen Montgomery MRN: [id number] DOB:1965-09-05, 57 y.o., female 20 Date: 09/09/2022  PCP: [name], MD REFERRING PROVIDER: Coralie Keens, MD   PT End of Session - 09/09/22 [phone]     Visit Number 6   screen only   PT Start Time 9924    PT Stop Time 0814    PT Time Calculation (min) 4 min    Activity Tolerance Patient tolerated treatment well    Behavior During Therapy [facility] for tasks assessed/performed             Past Medical History:  Diagnosis Date   Breast cancer ([place])    Diverticulosis    Hypothyroidism    Thyroid disease    Past Surgical History:  Procedure Laterality Date   APPENDECTOMY     BREAST LUMPECTOMY Right 06/08/2022   Procedure: RIGHT BREAST LUMPECTOMY;  Surgeon: Coralie Keens, MD;  Location: [place];  Service: General;  Laterality: Right;   BREAST REDUCTION SURGERY Bilateral 06/08/2022   Procedure: BILATERAL ONCOPLASTIC BREAST REDUCTION;  Surgeon: [name], MD;  Location: [place];  Service: Plastics;  Laterality: Bilateral;   ECTOPIC PREGNANCY SURGERY  1996   RADIOACTIVE SEED GUIDED AXILLARY SENTINEL LYMPH NODE Right 06/08/2022   Procedure: RADIOACTIVE SEED GUIDED RIGHT AXILLARY SENTINEL LYMPH NODE DISSECTION;  Surgeon: Coralie Keens, MD;  Location: [place];  Service: General;  Laterality: Right;   Patient Active Problem List   Diagnosis Date Noted   Genetic testing 04/27/2022   Family history of breast cancer 04/16/2022   Family history of colon cancer 04/16/2022   Family history of prostate cancer in father 04/16/2022   Malignant neoplasm of upper-outer quadrant of right breast in female, estrogen receptor positive ([place]) 04/13/2022   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   SI (sacroiliac) joint dysfunction 09/10/2016   Nonallopathic lesion of cervical region 09/10/2016   Nonallopathic lesion of rib cage 09/10/2016   Nonallopathic lesion of thoracic region 09/10/2016    Nonallopathic lesion of sacral region 09/10/2016    REFERRING DIAG: right breast cancer at risk for lymphedema  THERAPY DIAG:  Malignant neoplasm of upper-outer quadrant of right breast in female, estrogen receptor positive (HCC)  Abnormal posture  Stiffness of right shoulder, not elsewhere classified  Aftercare following surgery for neoplasm  PERTINENT HISTORY: Patient was diagnosed on 04/03/2022 with right 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 It measures 1.9 cm and is located in the upper outer quadrant. It is ER positive, PR negative and HER2 negative with a Ki67 of 60%. She has a positive axillary lymph node. Had a Rt lumpectomy with bil reduction on 06/08/22.  1/2 positive lymph nodes.   PRECAUTIONS: right UE Lymphedema risk,   SUBJECTIVE: Doing well  PAIN:  Are you having pain? No  SOZO SCREENING: Patient was assessed today using the SOZO machine to determine the lymphedema index score. This was compared to her baseline score. It was determined that she is within the recommended range when compared to her baseline and no further action is needed at this time. She will continue SOZO screenings. These are done every 3 months for 2 years post operatively followed by every 6 months for 2 years, and then annually.   Stark Bray, PT 09/09/2022, 8:14 AM

## 2022-09-10 ENCOUNTER — Other Ambulatory Visit: Payer: Self-pay

## 2022-09-10 ENCOUNTER — Ambulatory Visit

## 2022-09-10 ENCOUNTER — Ambulatory Visit
Admission: RE | Admit: 2022-09-10 | Discharge: 2022-09-10 | Disposition: A | Discharge status: Home / Self Care | Source: Ambulatory Visit | Attending: Radiation Oncology | Admitting: Radiation Oncology

## 2022-09-10 DIAGNOSIS — Z510 Encounter for antineoplastic radiation therapy: Secondary | ICD-10-CM | POA: Diagnosis not present

## 2022-09-10 LAB — RAD ONC ARIA SESSION SUMMARY

## 2022-09-11 ENCOUNTER — Other Ambulatory Visit: Payer: Self-pay

## 2022-09-11 ENCOUNTER — Ambulatory Visit

## 2022-09-11 ENCOUNTER — Ambulatory Visit
Admission: RE | Admit: 2022-09-11 | Discharge: 2022-09-11 | Disposition: A | Discharge status: Home / Self Care | Source: Ambulatory Visit | Attending: Radiation Oncology | Admitting: Radiation Oncology

## 2022-09-11 DIAGNOSIS — Z510 Encounter for antineoplastic radiation therapy: Secondary | ICD-10-CM | POA: Diagnosis not present

## 2022-09-11 LAB — RAD ONC ARIA SESSION SUMMARY

## 2022-09-14 ENCOUNTER — Ambulatory Visit
Admission: RE | Admit: 2022-09-14 | Discharge: 2022-09-14 | Disposition: A | Discharge status: Home / Self Care | Source: Ambulatory Visit | Attending: Radiation Oncology | Admitting: Radiation Oncology

## 2022-09-14 ENCOUNTER — Other Ambulatory Visit: Payer: Self-pay

## 2022-09-14 DIAGNOSIS — Z510 Encounter for antineoplastic radiation therapy: Secondary | ICD-10-CM | POA: Diagnosis not present

## 2022-09-14 LAB — RAD ONC ARIA SESSION SUMMARY

## 2022-09-15 ENCOUNTER — Ambulatory Visit

## 2022-09-16 ENCOUNTER — Other Ambulatory Visit: Payer: Self-pay

## 2022-09-16 ENCOUNTER — Ambulatory Visit
Admission: RE | Admit: 2022-09-16 | Discharge: 2022-09-16 | Disposition: A | Discharge status: Home / Self Care | Source: Ambulatory Visit | Attending: Radiation Oncology | Admitting: Radiation Oncology

## 2022-09-16 DIAGNOSIS — Z170 Estrogen receptor positive status [ER+]: Secondary | ICD-10-CM

## 2022-09-16 DIAGNOSIS — Z510 Encounter for antineoplastic radiation therapy: Secondary | ICD-10-CM | POA: Diagnosis not present

## 2022-09-16 LAB — RAD ONC ARIA SESSION SUMMARY
Course Elapsed Days: 7
Plan Fractions Treated to Date: 5
Plan Fractions Treated to Date: 5
Plan Prescribed Dose Per Fraction: 1.8 Gy
Plan Prescribed Dose Per Fraction: 1.8 Gy
Plan Total Fractions Prescribed: 28
Plan Total Fractions Prescribed: 28
Plan Total Prescribed Dose: 50.4 Gy
Plan Total Prescribed Dose: 50.4 Gy
Reference Point Dosage Given to Date: 9 Gy
Reference Point Dosage Given to Date: 9 Gy
Reference Point Session Dosage Given: 1.8 Gy
Reference Point Session Dosage Given: 1.8 Gy
Session Number: 5

## 2022-09-16 MED ORDER — ALRA NON-METALLIC DEODORANT (RAD-ONC)
1.0000 | Freq: Once | TOPICAL | Status: AC
  Administered 2022-09-16: 1 via TOPICAL

## 2022-09-16 MED ORDER — RADIAPLEXRX EX GEL: Freq: Once | CUTANEOUS | Status: AC

## 2022-09-17 ENCOUNTER — Ambulatory Visit
Admission: RE | Admit: 2022-09-17 | Discharge: 2022-09-17 | Disposition: A | Discharge status: Home / Self Care | Source: Ambulatory Visit | Attending: Radiation Oncology | Admitting: Radiation Oncology

## 2022-09-17 ENCOUNTER — Other Ambulatory Visit: Payer: Self-pay

## 2022-09-17 DIAGNOSIS — C50411 Malignant neoplasm of upper-outer quadrant of right female breast: Secondary | ICD-10-CM | POA: Diagnosis present

## 2022-09-17 DIAGNOSIS — Z510 Encounter for antineoplastic radiation therapy: Secondary | ICD-10-CM | POA: Diagnosis present

## 2022-09-17 LAB — RAD ONC ARIA SESSION SUMMARY

## 2022-09-18 ENCOUNTER — Other Ambulatory Visit: Payer: Self-pay

## 2022-09-18 ENCOUNTER — Ambulatory Visit
Admission: RE | Admit: 2022-09-18 | Discharge: 2022-09-18 | Disposition: A | Discharge status: Home / Self Care | Source: Ambulatory Visit | Attending: Radiation Oncology | Admitting: Radiation Oncology

## 2022-09-18 DIAGNOSIS — Z510 Encounter for antineoplastic radiation therapy: Secondary | ICD-10-CM | POA: Diagnosis not present

## 2022-09-18 LAB — RAD ONC ARIA SESSION SUMMARY
Course Elapsed Days: 9
Plan Fractions Treated to Date: 7
Plan Fractions Treated to Date: 7
Plan Prescribed Dose Per Fraction: 1.8 Gy
Plan Prescribed Dose Per Fraction: 1.8 Gy
Plan Total Fractions Prescribed: 28
Plan Total Fractions Prescribed: 28
Plan Total Prescribed Dose: 50.4 Gy
Plan Total Prescribed Dose: 50.4 Gy
Reference Point Dosage Given to Date: 12.6 Gy
Reference Point Dosage Given to Date: 12.6 Gy
Reference Point Session Dosage Given: 1.8 Gy
Reference Point Session Dosage Given: 1.8 Gy
Session Number: 7

## 2022-09-21 ENCOUNTER — Other Ambulatory Visit: Payer: Self-pay

## 2022-09-21 ENCOUNTER — Ambulatory Visit
Admission: RE | Admit: 2022-09-21 | Discharge: 2022-09-21 | Disposition: A | Discharge status: Home / Self Care | Source: Ambulatory Visit | Attending: Radiation Oncology | Admitting: Radiation Oncology

## 2022-09-21 ENCOUNTER — Other Ambulatory Visit: Admitting: Surgical

## 2022-09-21 ENCOUNTER — Encounter: Payer: Self-pay | Admitting: Radiation Oncology

## 2022-09-21 DIAGNOSIS — Z510 Encounter for antineoplastic radiation therapy: Secondary | ICD-10-CM | POA: Diagnosis not present

## 2022-09-21 LAB — RAD ONC ARIA SESSION SUMMARY
Course Elapsed Days: 12
Plan Fractions Treated to Date: 8
Plan Fractions Treated to Date: 8
Plan Prescribed Dose Per Fraction: 1.8 Gy
Plan Prescribed Dose Per Fraction: 1.8 Gy
Plan Total Fractions Prescribed: 28
Plan Total Fractions Prescribed: 28
Plan Total Prescribed Dose: 50.4 Gy
Plan Total Prescribed Dose: 50.4 Gy
Reference Point Dosage Given to Date: 14.4 Gy
Reference Point Dosage Given to Date: 14.4 Gy
Reference Point Session Dosage Given: 1.8 Gy
Reference Point Session Dosage Given: 1.8 Gy
Session Number: 8

## 2022-09-22 ENCOUNTER — Ambulatory Visit

## 2022-09-23 ENCOUNTER — Ambulatory Visit

## 2022-09-24 ENCOUNTER — Ambulatory Visit

## 2022-09-25 ENCOUNTER — Ambulatory Visit

## 2022-09-28 ENCOUNTER — Ambulatory Visit

## 2022-09-29 ENCOUNTER — Ambulatory Visit

## 2022-09-30 ENCOUNTER — Ambulatory Visit

## 2022-09-30 ENCOUNTER — Encounter: Payer: Self-pay | Admitting: *Deleted

## 2022-10-01 ENCOUNTER — Ambulatory Visit

## 2022-10-02 ENCOUNTER — Ambulatory Visit

## 2022-10-05 ENCOUNTER — Ambulatory Visit

## 2022-10-05 NOTE — Radiation Completion Notes (Signed)
Patient Name: Colleen Montgomery, Colleen Montgomery MRN: [id number] Date of Birth: 12/22/1965 Referring Physician: [name], M.D. Date of Service: 2022-10-05 Radiation Oncologist: Teryl Lucy, M.D. [place]                             RADIATION ONCOLOGY END OF TREATMENT NOTE     Diagnosis: C50.411 Malignant neoplasm of upper-outer quadrant of right female breast Staging on 2022-04-15: Malignant neoplasm of upper-outer quadrant of right breast in female, estrogen receptor positive ([place]) T=cT1c, N=cN1, M=cM0 Intent: Curative     ==========DELIVERED PLANS==========  First Treatment Date: 2022-09-02 - Last Treatment Date: 2022-09-30   Plan Name: Breast_R Site: Breast, Right Technique: 3D Mode: Photon Dose Per Fraction: 1.8 Gy Prescribed Dose (Delivered / Prescribed): 14.4 Gy / 50.4 Gy Prescribed Fxs (Delivered / Prescribed): 8 / 28   Plan Name: Breast_R_Scv Site: Breast, Right Technique: 3D Mode: Photon Dose Per Fraction: 1.8 Gy Prescribed Dose (Delivered / Prescribed): 14.4 Gy / 50.4 Gy Prescribed Fxs (Delivered / Prescribed): 8 / 28     ==========ON TREATMENT VISIT DATES========== 2022-09-16     ==========UPCOMING VISITS==========       ==========APPENDIX - ON TREATMENT VISIT NOTES==========   See weekly On Treatment Notes is Epic for details.

## 2022-10-06 ENCOUNTER — Ambulatory Visit: Admitting: Radiation Oncology

## 2022-10-06 ENCOUNTER — Ambulatory Visit

## 2022-10-07 ENCOUNTER — Ambulatory Visit

## 2022-10-07 ENCOUNTER — Ambulatory Visit: Admitting: Radiation Oncology

## 2022-10-08 ENCOUNTER — Ambulatory Visit

## 2022-10-09 ENCOUNTER — Ambulatory Visit

## 2022-10-09 ENCOUNTER — Ambulatory Visit (INDEPENDENT_AMBULATORY_CARE_PROVIDER_SITE_OTHER): Admitting: Physician Assistant

## 2022-10-09 ENCOUNTER — Encounter: Payer: Self-pay | Admitting: Physician Assistant

## 2022-10-09 VITALS — BP 125/78 | HR 90

## 2022-10-09 DIAGNOSIS — Z9889 Other specified postprocedural states: Secondary | ICD-10-CM

## 2022-10-09 NOTE — Progress Notes (Cosign Needed Addendum)
This is a 57 year old female with a past medical history of right-sided breast cancer status post right-sided lumpectomy and lymph node dissection as well as bilateral oncoplastic reduction by Dr. Erin Hearing on 06/08/2022 who presents for postoperative follow-up.  She was most recently seen in the office on 08/14/2022.  She notes since her last visit she has been doing well, she denies any issues with wounds.  She notes 1 area where a stitch below the skin is poking but not through the skin.  She denies any infectious signs or symptoms.  She notes she has completed radiation therapy.  Chaperone present.  On exam breasts are clean dry and intact with no noted wounds, no palpable fluid collections or area of firmness.  NAC's are viable.  Along the left inferior T-zone there is a stitch below the skin no overlying redness.  Overall the patient is doing very well, she has no wounds on exam today.  Given her significant improvement no need for ongoing follow-up or evaluation.  She understands that she may reach out to [place] at any point if she would like to be seen again or if she develops any issues.  The patient verbalized understanding and agreement to today's plan had no further questions or concerns at today's visit.

## 2022-10-12 ENCOUNTER — Ambulatory Visit

## 2022-10-13 ENCOUNTER — Ambulatory Visit

## 2022-10-13 NOTE — Progress Notes (Incomplete)
  Radiation Oncology         [phone] ________________________________  Name: DESIREY VEERKAMP MRN: [id number]  Date: 09/21/2022  DOB: 05/14/1966  End of Treatment Note  Diagnosis: Stage IIB (cT1c, cN1, cM0) Right Breast UOQ, Invasive Ductal Carcinoma, ER- / PR- / Her2-, Grade 3, s/p lumpectomy with clean margins and 1 positive axillary LN. She has declined chemotherapy (pursuing holistic treatment)        Indication for treatment: Curative        Radiation treatment dates: 09/02/22 through 09/21/22  Site/dose: Right breast treated with a total of 14.4 Gy delivered in 8 Fx at 1.8 Gy/Fx (intended dose was for a total of 50.4 Gy delivered in 28 Fx with the addition of boost, however the patient opted to discontinue treatment early)   Beams/energy: 3D Photon   Narrative: The patient tolerated radiation treatment relatively well without any significant side effects. As noted above, the patient did not complete the planned course of XRT as she opted to discontinue treatment early.   Plan: The patient will return to radiation oncology clinic for routine followup in one month. I advised them to call or return sooner if they have any questions or concerns related to their recovery or treatment.  -----------------------------------  Blair Promise, PhD, MD  This document serves as a record of services personally performed by Gery Pray, MD. It was created on his behalf by Roney Mans, a trained medical scribe. The creation of this record is based on the scribe's personal observations and the provider's statements to them. This document has been checked and approved by the attending provider.

## 2022-10-14 ENCOUNTER — Ambulatory Visit

## 2022-10-15 ENCOUNTER — Ambulatory Visit

## 2022-10-16 ENCOUNTER — Ambulatory Visit

## 2022-10-19 ENCOUNTER — Ambulatory Visit

## 2022-10-20 ENCOUNTER — Ambulatory Visit

## 2022-10-21 ENCOUNTER — Ambulatory Visit

## 2022-10-22 ENCOUNTER — Ambulatory Visit

## 2022-10-23 ENCOUNTER — Ambulatory Visit

## 2022-10-23 ENCOUNTER — Inpatient Hospital Stay: Attending: Hematology and Oncology | Admitting: Hematology and Oncology

## 2022-10-23 NOTE — Assessment & Plan Note (Deleted)
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 Given the positive lymph node and the high risk Oncotype, I recommended doing neoadjuvant chemotherapy.   Treatment plan: 1.  Neoadjuvant chemotherapy with Taxol Doristine Church followed by Adriamycin Cytoxan Keytruda followed by Beryle Flock maintenance for 1 year. 2. breast conserving surgery with targeted node dissection 3.  Adjuvant radiation 4. +/- Antiestrogens (based upon final pathology) -------------------------------------------------------------------------------------------------------- Patient wanted to pursue holistic treatment options.   Right lumpectomy 06/08/2022: Grade 3 IDC 5.5 cm, 1/2 lymph nodes positive focal extracapsular extension is present ER 0%, PR 0%, HER2 negative, Ki-67 60%  She is following with holistic doctors who have performed Onconomics analysis on circulating tumor cells for drug sensitivity.   Treatment plan: Patient is not interested in adjuvant chemotherapy. Adjuvant radiation Repeat prognostic panel on the final pathology was ER 0% PR 0% HER2 negative Ki-67 50%.  Therefore there is no role of antiestrogen therapy  Return to clinic in 3 months for survivorship care plan visit

## 2022-10-26 ENCOUNTER — Ambulatory Visit

## 2022-10-27 ENCOUNTER — Ambulatory Visit

## 2022-10-27 ENCOUNTER — Encounter: Payer: Self-pay | Admitting: *Deleted

## 2022-10-27 ENCOUNTER — Telehealth: Payer: Self-pay | Admitting: Family Medicine

## 2022-10-27 NOTE — Telephone Encounter (Signed)
Per 3/12 IB reached out to patient to schedule, left voicemail.

## 2022-10-28 ENCOUNTER — Ambulatory Visit

## 2022-10-29 ENCOUNTER — Ambulatory Visit

## 2022-11-02 ENCOUNTER — Encounter: Payer: Self-pay | Admitting: *Deleted

## 2022-12-01 NOTE — Progress Notes (Signed)
Patient Care Team: Farris Has, MD as PCP - General (Family Medicine) Pershing Proud, RN as Oncology Nurse Navigator Donnelly Angelica, RN as Oncology Nurse Navigator Abigail Miyamoto, MD as Consulting Physician (General Surgery) Serena Croissant, MD as Consulting Physician (Hematology and Oncology) Antony Blackbird, MD as Consulting Physician (Radiation Oncology)  DIAGNOSIS: No diagnosis found.  SUMMARY OF ONCOLOGIC HISTORY: Oncology History  Malignant neoplasm of upper-outer quadrant of right breast in female, estrogen receptor positive  04/03/2022 Initial Diagnosis   Palpable right breast mass UOQ, by ultrasound at 11 o'clock position 1.9 cm mass which on biopsy was grade 3 IDC ER 60%, PR 0%, Ki-67 60%, HER2 negative, 1 lymph node in the axilla: Biopsy positive   04/15/2022 Cancer Staging   Staging form: Breast, AJCC 8th Edition - Clinical: Stage IIB (cT1c, cN1, cM0, G3, ER+, PR-, HER2-) - Signed by Serena Croissant, MD on 04/15/2022 Stage prefix: Initial diagnosis Histologic grading system: 3 grade system    Genetic Testing   [facility] CancerNext-Expanded Panel was Negative. 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 Report date is 04/24/2022.  The CancerNext-Expanded gene panel offered by [facility] and includes sequencing, rearrangement, and RNA analysis for the following 77 genes: AIP, ALK, APC, ATM, AXIN2, BAP1, BARD1, BLM, BMPR1A, BRCA1, BRCA2, BRIP1, CDC73, CDH1, CDK4, CDKN1B, CDKN2A, CHEK2, CTNNA1, DICER1, FANCC, FH, FLCN, GALNT12, KIF1B, LZTR1, MAX, MEN1, MET, MLH1, MSH2, MSH3, MSH6, MUTYH, NBN, NF1, NF2, NTHL1, PALB2, PHOX2B, PMS2, POT1, PRKAR1A, PTCH1, PTEN, RAD51C, RAD51D, RB1, RECQL, RET, SDHA, SDHAF2, SDHB, SDHC, SDHD, SMAD4, SMARCA4, SMARCB1, SMARCE1, STK11, SUFU, TMEM127, TP53, TSC1, TSC2, VHL and XRCC2 (sequencing and deletion/duplication); EGFR, EGLN1, HOXB13, KIT, MITF, PDGFRA, POLD1, and POLE (sequencing only); EPCAM and GREM1  (deletion/duplication only).    06/02/2022 - 06/02/2022 Chemotherapy   Patient is on Treatment Plan : BREAST Pembrolizumab (200) D1 + Carboplatin (1.5) D1,8,15 + Paclitaxel (80) D1,8,15 q21d X 4 cycles / Pembrolizumab (200) D1 + AC D1 q21d x 4 cycles       CHIEF COMPLIANT: Surveillance  INTERVAL HISTORY: Colleen Montgomery is a 57 y.o. with above-mentioned. She presents to the clinic for a follow-up.   ALLERGIES:  is allergic to cephalexin.  MEDICATIONS:  Current Outpatient Medications  Medication Sig Dispense Refill   carboxymethylcellulose (REFRESH TEARS) 0.5 % SOLN Place 1 drop into both eyes daily.     thyroid (ARMOUR) 15 MG tablet Take 15 mg by mouth daily.     No current facility-administered medications for this visit.    PHYSICAL EXAMINATION: ECOG PERFORMANCE STATUS: {CHL ONC ECOG PS:[phone]}  There were no vitals filed for this visit. There were no vitals filed for this visit.  BREAST:*** No palpable masses or nodules in either right or left breasts. No palpable axillary supraclavicular or infraclavicular adenopathy no breast tenderness or nipple discharge. (exam performed in the presence of a chaperone)  LABORATORY DATA:  I have reviewed the data as listed    Latest Ref Rng & Units 04/15/2022   12:33 PM 05/27/2019    5:06 AM  CMP  Glucose 70 - 99 mg/dL 83  161   BUN 6 - 20 mg/dL 12  21   Creatinine 0.96 - 1.00 mg/dL 0.45  4.09   Sodium 811 - 145 mmol/L 140  140   Potassium 3.5 - 5.1 mmol/L 3.9  3.6   Chloride 98 - 111 mmol/L 106  106   CO2 22 - 32 mmol/L 28  25  Calcium 8.9 - 10.3 mg/dL 9.9  9.1   Total Protein 6.5 - 8.1 g/dL 7.1  7.4   Total Bilirubin 0.3 - 1.2 mg/dL 0.5  0.6   Alkaline Phos 38 - 126 U/L 61  69   AST 15 - 41 U/L 18  19   ALT 0 - 44 U/L 16  19     Lab Results  Component Value Date   WBC 5.5 06/05/2022   HGB 12.4 06/05/2022   HCT 37.6 06/05/2022   MCV 87.4 06/05/2022   PLT 213 06/05/2022   NEUTROABS 2.9 04/15/2022    ASSESSMENT  & PLAN:  No problem-specific Assessment & Plan notes found for this encounter.    No orders of the defined types were placed in this encounter.  The patient has a good understanding of the overall plan. she agrees with it. she will call with any problems that may develop before the next visit here. Total time spent: 30 mins including face to face time and time spent for planning, charting and co-ordination of care   Sherlyn Lick, CMA 12/01/22    I Janan Ridge am acting as a Neurosurgeon for [employer]  ***

## 2022-12-02 ENCOUNTER — Encounter: Payer: Self-pay | Admitting: *Deleted

## 2022-12-02 ENCOUNTER — Inpatient Hospital Stay: Attending: Hematology and Oncology | Admitting: Hematology and Oncology

## 2022-12-02 ENCOUNTER — Other Ambulatory Visit: Payer: Self-pay

## 2022-12-02 VITALS — BP 126/67 | HR 80 | Temp 97.5°F | Resp 18 | Ht 66.0 in | Wt 160.3 lb

## 2022-12-02 DIAGNOSIS — C50411 Malignant neoplasm of upper-outer quadrant of right female breast: Secondary | ICD-10-CM | POA: Diagnosis not present

## 2022-12-02 DIAGNOSIS — Z170 Estrogen receptor positive status [ER+]: Secondary | ICD-10-CM | POA: Diagnosis not present

## 2022-12-02 NOTE — Assessment & Plan Note (Addendum)
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 Given the positive lymph node and the high risk Oncotype, I recommended doing neoadjuvant chemotherapy.   Treatment plan: 1.  Neoadjuvant chemotherapy with Taxol Harrell Gave followed by Adriamycin Cytoxan Keytruda followed by Rande Lawman maintenance for 1 year. 2. breast conserving surgery with targeted node dissection 3.  Adjuvant radiation 4. +/- Antiestrogens (based upon final pathology) -------------------------------------------------------------------------------------------------------- Patient wanted to pursue holistic treatment options.   Right lumpectomy 06/08/2022: Grade 3 IDC 5.5 cm, 1/2 lymph nodes positive focal extracapsular extension is present ER 60% weak to moderate, PR 0%, HER2 negative, Ki-67 60%   She is following with holistic doctors who have performed Onconomics analysis on circulating tumor cells for drug sensitivity.   Treatment plan: Patient was not interested in adjuvant chemotherapy. Adjuvant radiation 09/02/2022-09/21/2022 (patient decided to stop radiation early) breast prognostic panel ER 0%, PR 0%, Ki-67 50%, HER2 1+ negative  Breast cancer surveillance: Recommended annual mammograms to be done in August 2024.  Abdominal pain and back pain: We will obtain a CT chest abdomen pelvis for further evaluation.  I will call her with the result of this test.  Return to clinic in 1 year for surveillance and follow-up.

## 2022-12-28 ENCOUNTER — Ambulatory Visit: Payer: 59 | Attending: Surgery

## 2022-12-28 VITALS — Wt 161.2 lb

## 2022-12-28 DIAGNOSIS — Z483 Aftercare following surgery for neoplasm: Secondary | ICD-10-CM

## 2022-12-28 NOTE — Therapy (Signed)
OUTPATIENT PHYSICAL THERAPY SOZO SCREENING NOTE   Patient Name: Colleen Montgomery MRN: [id number] DOB:12-14-1965, 57 y.o., female Today's Date: 12/28/2022  PCP: Farris Has, MD REFERRING PROVIDER: Abigail Miyamoto, MD   PT End of Session - 12/28/22 0813     Visit Number 6   # unchanged due to screen only   PT Start Time 0810    PT Stop Time 0815    PT Time Calculation (min) 5 min    Activity Tolerance Patient tolerated treatment well    Behavior During Therapy [facility] for tasks assessed/performed             Past Medical History:  Diagnosis Date   Breast cancer ([facility]CC)    Diverticulosis    Hypothyroidism    Thyroid disease    Past Surgical History:  Procedure Laterality Date   APPENDECTOMY     BREAST LUMPECTOMY Right 06/08/2022   Procedure: RIGHT BREAST LUMPECTOMY;  Surgeon: Abigail Miyamoto, MD;  Location: [facility];  Service: General;  Laterality: Right;   BREAST REDUCTION SURGERY Bilateral 06/08/2022   Procedure: BILATERAL ONCOPLASTIC BREAST REDUCTION;  Surgeon: Janne Napoleon, MD;  Location: [facility] OR;  Service: Plastics;  Laterality: Bilateral;   ECTOPIC PREGNANCY SURGERY  1996   RADIOACTIVE SEED GUIDED AXILLARY SENTINEL LYMPH NODE Right 06/08/2022   Procedure: RADIOACTIVE SEED GUIDED RIGHT AXILLARY SENTINEL LYMPH NODE DISSECTION;  Surgeon: Abigail Miyamoto, MD;  Location: [facility] OR;  Service: General;  Laterality: Right;   Patient Active Problem List   Diagnosis Date Noted   Genetic testing 04/27/2022   Family history of breast cancer 04/16/2022   Family history of colon cancer 04/16/2022   Family history of prostate cancer in father 04/16/2022   Malignant neoplasm of upper-outer quadrant of right breast in female, estrogen receptor positive (HCC) 04/13/2022   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 It measures 1.9 cm and is located in the upper outer quadrant. It is ER positive, PR negative and HER2 negative with a Ki67 of 60%. She has a positive axillary lymph node. Had a Rt lumpectomy with bil reduction on 06/08/22.  1/2 positive lymph nodes.   PRECAUTIONS: right UE Lymphedema risk,   SUBJECTIVE: Pt returns for her 3 month L-dex screen.   PAIN:  Are you having pain? No  SOZO SCREENING: Patient was assessed today using the SOZO machine to determine the lymphedema index score. This was compared to her baseline score. It was determined that she is within the recommended range when compared to her baseline and no further action is needed at this time. She will continue SOZO screenings. These are done every 3 months for 2 years post operatively followed by every 6 months for 2 years, and then annually.   L-DEX FLOWSHEETS - 12/28/22 0800       L-DEX LYMPHEDEMA SCREENING   Measurement Type Unilateral    L-DEX MEASUREMENT EXTREMITY Upper Extremity    POSITION  Standing    DOMINANT SIDE Right    At Risk Side Right    BASELINE SCORE (UNILATERAL) 0.5    L-DEX SCORE (  UNILATERAL) -0.1    VALUE CHANGE (UNILAT) -0.6              Hermenia Bers, PTA 12/28/2022, 8:15 AM

## 2023-01-01 ENCOUNTER — Ambulatory Visit (HOSPITAL_COMMUNITY)
Admission: RE | Admit: 2023-01-01 | Discharge: 2023-01-01 | Disposition: A | Discharge status: Home / Self Care | Payer: Managed Care, Other (non HMO) | Source: Ambulatory Visit | Attending: Hematology and Oncology | Admitting: Hematology and Oncology

## 2023-01-01 DIAGNOSIS — Z170 Estrogen receptor positive status [ER+]: Secondary | ICD-10-CM | POA: Diagnosis present

## 2023-01-01 DIAGNOSIS — C50411 Malignant neoplasm of upper-outer quadrant of right female breast: Secondary | ICD-10-CM | POA: Diagnosis present

## 2023-01-01 MED ORDER — SODIUM CHLORIDE (PF) 0.9 % IJ SOLN
INTRAMUSCULAR | Status: AC
  Filled 2023-01-01: qty 50

## 2023-01-01 MED ORDER — IOHEXOL 300 MG/ML  SOLN
100.0000 mL | Freq: Once | INTRAMUSCULAR | Status: AC | PRN
  Administered 2023-01-01: 100 mL via INTRAVENOUS

## 2023-01-05 ENCOUNTER — Inpatient Hospital Stay: Payer: Managed Care, Other (non HMO) | Attending: Hematology and Oncology | Admitting: Hematology and Oncology

## 2023-01-05 DIAGNOSIS — C50411 Malignant neoplasm of upper-outer quadrant of right female breast: Secondary | ICD-10-CM

## 2023-01-05 DIAGNOSIS — J219 Acute bronchiolitis, unspecified: Secondary | ICD-10-CM | POA: Diagnosis not present

## 2023-01-05 DIAGNOSIS — Z170 Estrogen receptor positive status [ER+]: Secondary | ICD-10-CM | POA: Diagnosis not present

## 2023-01-05 NOTE — Progress Notes (Signed)
HEMATOLOGY-ONCOLOGY TELEPHONE VISIT PROGRESS NOTE  I connected with our patient on 01/05/23 at  9:30 AM EDT by telephone and verified that I am speaking with the correct person using two identifiers.  I discussed the limitations, risks, security and privacy concerns of performing an evaluation and management service by telephone and the availability of in person appointments.  I also discussed with the patient that there may be a patient responsible charge related to this service. The patient expressed understanding and agreed to proceed.   History of Present Illness:  Colleen Montgomery is a 57 y.o. with above-mentioned breast cancer surveillance. She presents to the clinic for a telephone follow-up to discuss ct scans. She reports no new problems  Oncology History  Malignant neoplasm of upper-outer quadrant of right breast in female, estrogen receptor positive (HCC)  04/03/2022 Initial Diagnosis   Palpable right breast mass UOQ, by ultrasound at 11 o'clock position 1.9 cm mass which on biopsy was grade 3 IDC ER 60%, PR 0%, Ki-67 60%, HER2 negative, 1 lymph node in the axilla: Biopsy positive   04/15/2022 Cancer Staging   Staging form: Breast, AJCC 8th Edition - Clinical: Stage IIB (cT1c, cN1, cM0, G3, ER+, PR-, HER2-) - Signed by Serena Croissant, MD on 04/15/2022 Stage prefix: Initial diagnosis Histologic grading system: 3 grade system    Genetic Testing   [facility] CancerNext-Expanded Panel was Negative. 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 Report date is 04/24/2022.  The CancerNext-Expanded gene panel offered by [facility] and includes sequencing, rearrangement, and RNA analysis for the following 77 genes: AIP, ALK, APC, ATM, AXIN2, BAP1, BARD1, BLM, BMPR1A, BRCA1, BRCA2, BRIP1, CDC73, CDH1, CDK4, CDKN1B, CDKN2A, CHEK2, CTNNA1, DICER1, FANCC, FH, FLCN, GALNT12, KIF1B, LZTR1, MAX, MEN1, MET, MLH1, MSH2, MSH3, MSH6, MUTYH, NBN, NF1, NF2, NTHL1,  PALB2, PHOX2B, PMS2, POT1, PRKAR1A, PTCH1, PTEN, RAD51C, RAD51D, RB1, RECQL, RET, SDHA, SDHAF2, SDHB, SDHC, SDHD, SMAD4, SMARCA4, SMARCB1, SMARCE1, STK11, SUFU, TMEM127, TP53, TSC1, TSC2, VHL and XRCC2 (sequencing and deletion/duplication); EGFR, EGLN1, HOXB13, KIT, MITF, PDGFRA, POLD1, and POLE (sequencing only); EPCAM and GREM1 (deletion/duplication only).    06/02/2022 - 06/02/2022 Chemotherapy   Patient is on Treatment Plan : BREAST Pembrolizumab (200) D1 + Carboplatin (1.5) D1,8,15 + Paclitaxel (80) D1,8,15 q21d X 4 cycles / Pembrolizumab (200) D1 + AC D1 q21d x 4 cycles       REVIEW OF SYSTEMS:   Constitutional: Denies fevers, chills or abnormal weight loss All other systems were reviewed with the patient and are negative. Observations/Objective:     Assessment Plan:  Malignant neoplasm of upper-outer quadrant of right breast in female, estrogen receptor positive (HCC) 04/03/2022:Palpable right breast mass UOQ, by ultrasound at 11 o'clock position 1.9 cm mass which on biopsy was grade 3 IDC ER 60%, PR 0%, Ki-67 60%, HER2 negative, 1 lymph node in the axilla: Biopsy positive.   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 Given the positive lymph node and the high risk Oncotype, I recommended doing neoadjuvant chemotherapy.   Treatment plan: 1.  Neoadjuvant chemotherapy with Taxol Harrell Gave followed by Adriamycin Cytoxan Keytruda followed by Rande Lawman maintenance for 1 year. 2. breast conserving surgery with targeted node dissection 3.  Adjuvant radiation 4. +/- Antiestrogens (based upon final pathology) -------------------------------------------------------------------------------------------------------- Patient wanted to pursue holistic treatment options.   Right lumpectomy 06/08/2022: Grade 3 IDC 5.5 cm, 1/2 lymph nodes  positive focal extracapsular extension is present  ER 60% weak to moderate, PR 0%, HER2 negative, Ki-67 60%   She is following with holistic doctors who have performed Onconomics analysis on circulating tumor cells for drug sensitivity.   Treatment plan: Patient was not interested in adjuvant chemotherapy. Adjuvant radiation 09/02/2022-09/21/2022 (patient decided to stop radiation early) breast prognostic panel ER 0%, PR 0%, Ki-67 50%, HER2 1+ negative   Breast cancer surveillance: Recommended annual mammograms to be done in August 2024.   Abdominal pain and back pain: CT CAP 01/02/2023: No evidence of metastatic disease.  Mild bilateral upper lung bronchial wall thickening (bronchiolitis)   return to clinic in 1 year for surveillance and follow-up.   I discussed the assessment and treatment plan with the patient. The patient was provided an opportunity to ask questions and all were answered. The patient agreed with the plan and demonstrated an understanding of the instructions. The patient was advised to call back or seek an in-person evaluation if the symptoms worsen or if the condition fails to improve as anticipated.   I provided 12 minutes of non-face-to-face time during this encounter.  This includes time for charting and coordination of care   Tamsen Meek, MD  I Janan Ridge am acting as a scribe for Dr.Nicolo Tomko  I have reviewed the above documentation for accuracy and completeness, and I agree with the above.

## 2023-01-05 NOTE — Assessment & Plan Note (Signed)
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 Given the positive lymph node and the high risk Oncotype, I recommended doing neoadjuvant chemotherapy.   Treatment plan: 1.  Neoadjuvant chemotherapy with Taxol Harrell Gave followed by Adriamycin Cytoxan Keytruda followed by Rande Lawman maintenance for 1 year. 2. breast conserving surgery with targeted node dissection 3.  Adjuvant radiation 4. +/- Antiestrogens (based upon final pathology) -------------------------------------------------------------------------------------------------------- Patient wanted to pursue holistic treatment options.   Right lumpectomy 06/08/2022: Grade 3 IDC 5.5 cm, 1/2 lymph nodes positive focal extracapsular extension is present ER 60% weak to moderate, PR 0%, HER2 negative, Ki-67 60%   She is following with holistic doctors who have performed Onconomics analysis on circulating tumor cells for drug sensitivity.   Treatment plan: Patient was not interested in adjuvant chemotherapy. Adjuvant radiation 09/02/2022-09/21/2022 (patient decided to stop radiation early) breast prognostic panel ER 0%, PR 0%, Ki-67 50%, HER2 1+ negative   Breast cancer surveillance: Recommended annual mammograms to be done in August 2024.   Abdominal pain and back pain: CT CAP 01/02/2023: No evidence of metastatic disease.  Mild bilateral upper lung bronchial wall thickening (bronchiolitis)   return to clinic in 1 year for surveillance and follow-up.

## 2023-03-16 ENCOUNTER — Encounter: Payer: Self-pay | Admitting: Pulmonary Disease

## 2023-03-16 ENCOUNTER — Ambulatory Visit (INDEPENDENT_AMBULATORY_CARE_PROVIDER_SITE_OTHER): Payer: 59 | Admitting: Pulmonary Disease

## 2023-03-16 VITALS — BP 118/66 | HR 70 | Ht 66.0 in | Wt 157.0 lb

## 2023-03-16 DIAGNOSIS — J219 Acute bronchiolitis, unspecified: Secondary | ICD-10-CM | POA: Diagnosis not present

## 2023-03-16 NOTE — Progress Notes (Signed)
Synopsis: Referred in July 2024 for bronchiolitis by Dr Pamelia Hoit, MD  Subjective:   PATIENT ID: Colleen Montgomery GENDER: female DOB: 1966-05-06, MRN: [id number]  HPI  Chief Complaint  Patient presents with   Consult    Referred by Dr. Pamelia Hoit for abnormal CT back in May 2025. Denies any breathing concerns. Did have COVID in December 2022.    Colleen Montgomery is a 57 year old woman, never smoker with history of hypothyroidism and breast cancer who is referred to pulmonary clinic for bronchiolitis.   She denies any cough, wheezing, chest tightness or dyspnea. She is active hiking/walking 2-5 miles per day with her dog. She denies any respiratory issues currently. She denies skin rash, muscle or joint aches. She has history of hypothyroidism and is on armour thyroid - changed from levothyroxine last year.   She does not currently smoke. She smoked cigars in the past, quit in 2011. She burns candles and incense at home. Does have intermittent exposure to camp fires. She has a Development worker, international aid. No bird exposures. No current water damage issues at home. She had mild water issues in 2022 which have been remedied. She works as a Therapist, music at [employer], mainly works in facilities. She had covid in 07/2021.  Past Medical History:  Diagnosis Date   Breast cancer (HCC)    Diverticulosis    Hypothyroidism    Thyroid disease      Family History  Problem Relation Age of Onset   Prostate cancer Father 62       metastatic   Colon cancer Maternal Uncle        dx. 60s/70s   Breast cancer Maternal Grandmother      Social History   Socioeconomic History   Marital status: Married    Spouse name: Not on file   Number of children: Not on file   Years of education: Not on file   Highest education level: Not on file  Occupational History   Not on file  Tobacco Use   Smoking status: Never   Smokeless tobacco: Never  Vaping Use   Vaping status: Never Used  Substance and Sexual Activity    Alcohol use: Not Currently    Comment: rarely- no alcohol in 2 weeks   Drug use: Not Currently   Sexual activity: Not on file  Other Topics Concern   Not on file  Social History Narrative   Not on file   Social Determinants of Health   Financial Resource Strain: Not on file  Food Insecurity: Not on file  Transportation Needs: Not on file  Physical Activity: Not on file  Stress: Not on file  Social Connections: Not on file  Intimate Partner Violence: Not on file     Allergies  Allergen Reactions   Cephalexin Rash     Outpatient Medications Prior to Visit  Medication Sig Dispense Refill   ARMOUR THYROID 60 MG tablet Take 60 mg by mouth daily.     Omega 3 1000 MG CAPS 1 capsule Orally Once a day     Pediatric Multivitamins-Fl (MULTIVITAMIN + FLUORIDE) 0.25 MG CHEW 1 tablet by mouth Once daily     carboxymethylcellulose (REFRESH TEARS) 0.5 % SOLN Place 1 drop into both eyes daily.     albuterol (VENTOLIN HFA) 108 (90 Base) MCG/ACT inhaler Inhale into the lungs.     thyroid (ARMOUR) 15 MG tablet Take 15 mg by mouth daily.     No facility-administered medications prior to visit.  Review of Systems  Constitutional:  Negative for chills, fever, malaise/fatigue and weight loss.  HENT:  Negative for congestion, sinus pain and sore throat.   Eyes: Negative.   Respiratory:  Negative for cough, hemoptysis, sputum production, shortness of breath and wheezing.   Cardiovascular:  Negative for chest pain, palpitations, orthopnea, claudication and leg swelling.  Gastrointestinal:  Negative for abdominal pain, heartburn, nausea and vomiting.  Genitourinary: Negative.   Musculoskeletal:  Negative for joint pain and myalgias.  Skin:  Negative for rash.  Neurological:  Negative for weakness.  Endo/Heme/Allergies: Negative.   Psychiatric/Behavioral: Negative.     Objective:   Vitals:   03/16/23 0838  BP: 118/66  Pulse: 70  SpO2: 99%  Weight: 157 lb (71.2 kg)  Height: 5\' 6"  (1.676  m)   Physical Exam Constitutional:      General: She is not in acute distress.    Appearance: She is not ill-appearing.  HENT:     Head: Normocephalic and atraumatic.  Eyes:     General: No scleral icterus.    Conjunctiva/sclera: Conjunctivae normal.  Cardiovascular:     Rate and Rhythm: Normal rate and regular rhythm.     Pulses: Normal pulses.     Heart sounds: Normal heart sounds. No murmur heard. Pulmonary:     Effort: Pulmonary effort is normal.     Breath sounds: Normal breath sounds. No wheezing, rhonchi or rales.  Musculoskeletal:     Right lower leg: No edema.     Left lower leg: No edema.  Skin:    General: Skin is warm and dry.  Neurological:     General: No focal deficit present.     Mental Status: She is alert.    CBC    Component Value Date/Time   WBC 5.5 06/05/2022 1145   RBC 4.30 06/05/2022 1145   HGB 12.4 06/05/2022 1145   HGB 12.6 04/15/2022 1233   HCT 37.6 06/05/2022 1145   PLT 213 06/05/2022 1145   PLT 235 04/15/2022 1233   MCV 87.4 06/05/2022 1145   MCH 28.8 06/05/2022 1145   MCHC 33.0 06/05/2022 1145   RDW 12.7 06/05/2022 1145   LYMPHSABS 1.2 04/15/2022 1233   MONOABS 0.4 04/15/2022 1233   EOSABS 0.1 04/15/2022 1233   BASOSABS 0.0 04/15/2022 1233      Latest Ref Rng & Units 04/15/2022   12:33 PM 05/27/2019    5:06 AM  BMP  Glucose 70 - 99 mg/dL 83  161   BUN 6 - 20 mg/dL 12  21   Creatinine 0.96 - 1.00 mg/dL 0.45  4.09   Sodium 811 - 145 mmol/L 140  140   Potassium 3.5 - 5.1 mmol/L 3.9  3.6   Chloride 98 - 111 mmol/L 106  106   CO2 22 - 32 mmol/L 28  25   Calcium 8.9 - 10.3 mg/dL 9.9  9.1    Chest imaging: CT Chest 01/01/23 1. Status post right lumpectomy with axillary lymph node dissection. 2. No evidence of lymphadenopathy or metastatic disease in the chest, abdomen, or pelvis. 3. Mild diffuse bilateral bronchial wall thickening and background of very tiny centrilobular pulmonary nodules, most concentrated in the lung apices,  most commonly seen in smoking-related respiratory bronchiolitis. 4. Descending and sigmoid diverticula without evidence of acute diverticulitis.  PFT:     No data to display          Labs:  Path:  Echo:  Heart Catheterization:    Assessment & Plan:  Bronchiolitis - Plan: CT CHEST HIGH RESOLUTION  Discussion: Jaiyana Pressler is a 57 year old woman, never smoker with history of hypothyroidism and breast cancer who is referred to pulmonary clinic for bronchiolitis.  There is upper lobe predominance to the mild diffuse bronchial wall thickening and tiny centrilobular pulmonary nodules which is concerning for possible bronchiolitis process. She is a non-smoker. Only concerning risk factor for these findings would be burning candles or incense in her home. Recommend that she not use these products and we will check HRCT chest in 6 months.   Follow up in 6 months after CT Chest scan.   Melody Comas, MD [facility] Office: [phone]    Current Outpatient Medications:    ARMOUR THYROID 60 MG tablet, Take 60 mg by mouth daily., Disp: , Rfl:    Omega 3 1000 MG CAPS, 1 capsule Orally Once a day, Disp: , Rfl:    Pediatric Multivitamins-Fl (MULTIVITAMIN + FLUORIDE) 0.25 MG CHEW, 1 tablet by mouth Once daily, Disp: , Rfl:

## 2023-03-16 NOTE — Patient Instructions (Addendum)
You have mild findings of bronchiolitis of the upper lungs  We will schedule you follow up in 6 months after HRCT Chest scan  Recommend not burning candles or incense in the home

## 2023-04-05 ENCOUNTER — Ambulatory Visit: Payer: 59 | Attending: Surgery

## 2023-04-05 VITALS — Wt 159.2 lb

## 2023-04-05 DIAGNOSIS — Z483 Aftercare following surgery for neoplasm: Secondary | ICD-10-CM | POA: Insufficient documentation

## 2023-04-05 NOTE — Therapy (Signed)
OUTPATIENT PHYSICAL THERAPY SOZO SCREENING NOTE   Patient Name: Colleen Montgomery MRN: [id number] DOB:Jun 27, 1966, 57 y.o., female Today's Date: 04/05/2023  PCP: Farris Has, MD REFERRING PROVIDER: Farris Has, MD   PT End of Session - 04/05/23 [phone]     Visit Number 6   # unchanged due to screen only   PT Start Time 0841    PT Stop Time 0845    PT Time Calculation (min) 4 min    Behavior During Therapy [facility] for tasks assessed/performed             Past Medical History:  Diagnosis Date   Breast cancer (HCC)    Diverticulosis    Hypothyroidism    Thyroid disease    Past Surgical History:  Procedure Laterality Date   APPENDECTOMY     BREAST LUMPECTOMY Right 06/08/2022   Procedure: RIGHT BREAST LUMPECTOMY;  Surgeon: Abigail Miyamoto, MD;  Location: [facility] OR;  Service: General;  Laterality: Right;   BREAST REDUCTION SURGERY Bilateral 06/08/2022   Procedure: BILATERAL ONCOPLASTIC BREAST REDUCTION;  Surgeon: Janne Napoleon, MD;  Location: [facility] OR;  Service: Plastics;  Laterality: Bilateral;   ECTOPIC PREGNANCY SURGERY  1996   RADIOACTIVE SEED GUIDED AXILLARY SENTINEL LYMPH NODE Right 06/08/2022   Procedure: RADIOACTIVE SEED GUIDED RIGHT AXILLARY SENTINEL LYMPH NODE DISSECTION;  Surgeon: Abigail Miyamoto, MD;  Location: [facility] OR;  Service: General;  Laterality: Right;   Patient Active Problem List   Diagnosis Date Noted   Genetic testing 04/27/2022   Family history of breast cancer 04/16/2022   Family history of colon cancer 04/16/2022   Family history of prostate cancer in father 04/16/2022   Malignant neoplasm of upper-outer quadrant of right breast in female, estrogen receptor positive (HCC) 04/13/2022   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 It measures 1.9 cm and is located in the upper outer quadrant. It is ER positive, PR negative and HER2 negative with a Ki67 of 60%. She has a positive axillary lymph node. Had a Rt lumpectomy with bil reduction on 06/08/22.  1/2 positive lymph nodes.   PRECAUTIONS: right UE Lymphedema risk,   SUBJECTIVE: Pt returns for her 3 month L-dex screen.   PAIN:  Are you having pain? No  SOZO SCREENING: Patient was assessed today using the SOZO machine to determine the lymphedema index score. This was compared to her baseline score. It was determined that she is within the recommended range when compared to her baseline and no further action is needed at this time. She will continue SOZO screenings. These are done every 3 months for 2 years post operatively followed by every 6 months for 2 years, and then annually.   L-DEX FLOWSHEETS - 04/05/23 0800       L-DEX LYMPHEDEMA SCREENING   Measurement Type Unilateral    L-DEX MEASUREMENT EXTREMITY Upper Extremity    POSITION  Standing    DOMINANT SIDE Right    At Risk Side Right    BASELINE SCORE (UNILATERAL) 0.5    L-DEX SCORE (UNILATERAL) -2    VALUE CHANGE (UNILAT) -2.5  Hermenia Bers, PTA 04/05/2023, 8:44 AM

## 2023-04-21 ENCOUNTER — Other Ambulatory Visit: Payer: Self-pay | Admitting: Obstetrics and Gynecology

## 2023-04-21 DIAGNOSIS — Z853 Personal history of malignant neoplasm of breast: Secondary | ICD-10-CM

## 2023-04-29 DIAGNOSIS — Z170 Estrogen receptor positive status [ER+]: Secondary | ICD-10-CM

## 2023-04-29 DIAGNOSIS — C50411 Malignant neoplasm of upper-outer quadrant of right female breast: Secondary | ICD-10-CM

## 2023-04-29 NOTE — Research (Signed)
MTG-015 - Tissue and Bodily Fluids: Translational Medicine: Discovery and Evaluation of Biomarkers/Pharmacogenomics for the Diagnosis and Personalized Management of Patients    One Year Follow Up:   Research nurse called patient today for her one year protocol follow up. Patient does not report any new medical conditions or diagnosis. She states that her breast cancer is clear. She is going for her Mammogram at [facility] next month. Medication list was reviewed in detail. Patient verified that all medications listed are correct, and there have been no new medications added to her regimen. Patient was informed we will mail her a [employer] gift card of $50 to her home for her participation in the clinical trial follow up today. Mailing address was confirmed to be correct. Instructions were provided for activation of a pin number if she is interested for the gift card. The patient is aware this will be the last follow up for this protocol. She was thanked for her participation in the clinical trial.  Chriss Driver, RN 04/29/23 4:32 PM

## 2023-05-12 ENCOUNTER — Encounter: Payer: Self-pay | Admitting: Radiation Oncology

## 2023-05-12 NOTE — Progress Notes (Incomplete)
Radiation Oncology         [phone] ________________________________  Name: Colleen Montgomery MRN: [id number]  Date: 05/12/2023  DOB: [birth year]  End of Treatment Note  Diagnosis: Stage IIB (cT1c, cN1, cM0) Right Breast UOQ, Invasive Ductal Carcinoma, ER- / PR- / Her2-, Grade 3, s/p lumpectomy with clean margins and 1 positive axillary LN. She has declined chemotherapy (pursuing holistic treatment)        Indication for treatment: Curative        Radiation treatment dates: 09/02/22 through 09/21/22  Site/dose: Right breast treated with a total of 14.4 Gy delivered in 8 Fx at 1.8 Gy/Fx (intended dose was for a total of 50.4 Gy delivered in 28 Fx with the addition of boost, however the patient opted to discontinue treatment early)   Beams/energy: 3D Photon   Narrative: The patient tolerated radiation treatment relatively well without any significant side effects. As noted above, the patient did not complete the planned course of XRT as she opted to discontinue treatment early.   Plan: The patient will return to radiation oncology clinic for routine followup in one month. I advised them to call or return sooner if they have any questions or concerns related to their recovery or treatment.  -----------------------------------  Billie Lade, PhD, MD  This document serves as a record of services personally performed by Antony Blackbird, MD. It was created on his behalf by Neena Rhymes, a trained medical scribe. The creation of this record is based on the scribe's personal observations and the provider's statements to them. This document has been checked and approved by the attending provider.

## 2023-06-28 ENCOUNTER — Ambulatory Visit: Payer: 59 | Attending: Surgery

## 2023-06-28 DIAGNOSIS — Z170 Estrogen receptor positive status [ER+]: Secondary | ICD-10-CM | POA: Insufficient documentation

## 2023-06-28 DIAGNOSIS — Z483 Aftercare following surgery for neoplasm: Secondary | ICD-10-CM | POA: Insufficient documentation

## 2023-06-28 DIAGNOSIS — C50411 Malignant neoplasm of upper-outer quadrant of right female breast: Secondary | ICD-10-CM | POA: Insufficient documentation

## 2023-07-02 ENCOUNTER — Ambulatory Visit: Payer: 59 | Admitting: Rehabilitation

## 2023-07-02 ENCOUNTER — Encounter: Payer: Self-pay | Admitting: Rehabilitation

## 2023-07-02 DIAGNOSIS — Z483 Aftercare following surgery for neoplasm: Secondary | ICD-10-CM

## 2023-07-02 DIAGNOSIS — C50411 Malignant neoplasm of upper-outer quadrant of right female breast: Secondary | ICD-10-CM

## 2023-07-02 NOTE — Therapy (Signed)
OUTPATIENT PHYSICAL THERAPY SOZO SCREENING NOTE   Patient Name: Colleen Montgomery MRN: [id number] DOB:April 28, 1966, 57 y.o., female Today's Date: 07/02/2023  PCP: Farris Has, MD REFERRING PROVIDER: Abigail Miyamoto, MD   PT End of Session - 07/02/23 1101     Visit Number 6   screen   PT Start Time 1014    PT Stop Time 1016    PT Time Calculation (min) 2 min    Activity Tolerance Patient tolerated treatment well    Behavior During Therapy [facility] for tasks assessed/performed             Past Medical History:  Diagnosis Date   Breast cancer (HCC)    Diverticulosis    Hypothyroidism    Thyroid disease    Past Surgical History:  Procedure Laterality Date   APPENDECTOMY     BREAST LUMPECTOMY Right 06/08/2022   Procedure: RIGHT BREAST LUMPECTOMY;  Surgeon: Abigail Miyamoto, MD;  Location: [facility] OR;  Service: General;  Laterality: Right;   BREAST REDUCTION SURGERY Bilateral 06/08/2022   Procedure: BILATERAL ONCOPLASTIC BREAST REDUCTION;  Surgeon: Janne Napoleon, MD;  Location: [facility] OR;  Service: Plastics;  Laterality: Bilateral;   ECTOPIC PREGNANCY SURGERY  1996   RADIOACTIVE SEED GUIDED AXILLARY SENTINEL LYMPH NODE Right 06/08/2022   Procedure: RADIOACTIVE SEED GUIDED RIGHT AXILLARY SENTINEL LYMPH NODE DISSECTION;  Surgeon: Abigail Miyamoto, MD;  Location: [facility] OR;  Service: General;  Laterality: Right;   Patient Active Problem List   Diagnosis Date Noted   Genetic testing 04/27/2022   Family history of breast cancer 04/16/2022   Family history of colon cancer 04/16/2022   Family history of prostate cancer in father 04/16/2022   Malignant neoplasm of upper-outer quadrant of right breast in female, estrogen receptor positive (HCC) 04/13/2022   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 It measures 1.9 cm and is located in the upper outer quadrant. It is ER positive, PR negative and HER2 negative with a Ki67 of 60%. She has a positive axillary lymph node. Had a Rt lumpectomy with bil reduction on 06/08/22.  1/2 positive lymph nodes.   PRECAUTIONS: right UE Lymphedema risk,   SUBJECTIVE: Pt returns for her 3 month L-dex screen.   PAIN:  Are you having pain? No  SOZO SCREENING: Patient was assessed today using the SOZO machine to determine the lymphedema index score. This was compared to her baseline score. It was determined that she is within the recommended range when compared to her baseline and no further action is needed at this time. She will continue SOZO screenings. These are done every 3 months for 2 years post operatively followed by every 6 months for 2 years, and then annually.   L-DEX FLOWSHEETS - 07/02/23 1100       L-DEX LYMPHEDEMA SCREENING   Measurement Type Unilateral    L-DEX MEASUREMENT EXTREMITY Upper Extremity    POSITION  Standing    DOMINANT SIDE Right    At Risk Side Right  BASELINE SCORE (UNILATERAL) 0.5    L-DEX SCORE (UNILATERAL) -2    VALUE CHANGE (UNILAT) -2.5              Renan Danese, Julieanne Manson, PT 07/02/2023, 11:02 AM

## 2023-09-01 ENCOUNTER — Other Ambulatory Visit: Payer: 59

## 2023-09-24 ENCOUNTER — Ambulatory Visit: Payer: 59 | Attending: Surgery

## 2023-09-24 VITALS — Wt 166.1 lb

## 2023-09-24 DIAGNOSIS — Z483 Aftercare following surgery for neoplasm: Secondary | ICD-10-CM | POA: Insufficient documentation

## 2023-09-24 NOTE — Therapy (Signed)
 OUTPATIENT PHYSICAL THERAPY SOZO SCREENING NOTE   Patient Name: Colleen Montgomery MRN: [id number] DOB:March 17, 1966, 58 y.o., female Today's Date: 09/24/2023  PCP: Kip Righter, MD REFERRING PROVIDER: Vernetta Berg, MD   PT End of Session - 09/24/23 0913     Visit Number 6   # unchanged due to screen only   PT Start Time 0811    PT Stop Time 0815    PT Time Calculation (min) 4 min    Activity Tolerance Patient tolerated treatment well    Behavior During Therapy [facility] for tasks assessed/performed             Past Medical History:  Diagnosis Date   Breast cancer (HCC)    Diverticulosis    Hypothyroidism    Thyroid  disease    Past Surgical History:  Procedure Laterality Date   APPENDECTOMY     BREAST LUMPECTOMY Right 06/08/2022   Procedure: RIGHT BREAST LUMPECTOMY;  Surgeon: Vernetta Berg, MD;  Location: [facility] OR;  Service: General;  Laterality: Right;   BREAST REDUCTION SURGERY Bilateral 06/08/2022   Procedure: BILATERAL ONCOPLASTIC BREAST REDUCTION;  Surgeon: Marene Sieving, MD;  Location: [facility] OR;  Service: Plastics;  Laterality: Bilateral;   ECTOPIC PREGNANCY SURGERY  1996   RADIOACTIVE SEED GUIDED AXILLARY SENTINEL LYMPH NODE Right 06/08/2022   Procedure: RADIOACTIVE SEED GUIDED RIGHT AXILLARY SENTINEL LYMPH NODE DISSECTION;  Surgeon: Vernetta Berg, MD;  Location: [facility] OR;  Service: General;  Laterality: Right;   Patient Active Problem List   Diagnosis Date Noted   Genetic testing 04/27/2022   Family history of breast cancer 04/16/2022   Family history of colon cancer 04/16/2022   Family history of prostate cancer in father 04/16/2022   Malignant neoplasm of upper-outer quadrant of right breast in female, estrogen receptor positive (HCC) 04/13/2022   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 It measures 1.9 cm and is located in the upper outer quadrant. It is ER positive, PR negative and HER2 negative with a Ki67 of 60%. She has a positive axillary lymph node. Had a Rt lumpectomy with bil reduction on 06/08/22.  1/2 positive lymph nodes.   PRECAUTIONS: right UE Lymphedema risk,   SUBJECTIVE: Pt returns for her 3 month L-dex screen.   PAIN:  Are you having pain? No  SOZO SCREENING: Patient was assessed today using the SOZO machine to determine the lymphedema index score. This was compared to her baseline score. It was determined that she is within the recommended range when compared to her baseline and no further action is needed at this time. She will continue SOZO screenings. These are done every 3 months for 2 years post operatively followed by every 6 months for 2 years, and then annually.   L-DEX FLOWSHEETS - 09/24/23 0900       L-DEX LYMPHEDEMA SCREENING   Measurement Type Unilateral    L-DEX MEASUREMENT EXTREMITY Upper Extremity    POSITION  Standing    DOMINANT SIDE Right    At Risk Side Right    BASELINE SCORE (UNILATERAL) 0.5    L-DEX SCORE (  UNILATERAL) 1.5    VALUE CHANGE (UNILAT) 1              Aden Berwyn Caldron, PTA 09/24/2023, 9:15 AM

## 2023-09-27 ENCOUNTER — Ambulatory Visit
Admission: RE | Admit: 2023-09-27 | Discharge: 2023-09-27 | Disposition: A | Discharge status: Home / Self Care | Source: Ambulatory Visit | Attending: Pulmonary Disease | Admitting: Pulmonary Disease

## 2023-09-27 DIAGNOSIS — J219 Acute bronchiolitis, unspecified: Secondary | ICD-10-CM

## 2023-10-05 ENCOUNTER — Telehealth: Payer: Self-pay | Admitting: Pulmonary Disease

## 2023-10-05 NOTE — Telephone Encounter (Signed)
Patient would like a call when her CT results are available. She can be reached at [phone]

## 2023-10-06 NOTE — Telephone Encounter (Signed)
CT scan has not been read by Radiologist, images are in the computer.  ATC patient x1.  Left detailed message that we need to get her scheduled for an OV as we have not seen her since March 16, 2023.  When she returns call, please schedule her for an OV, next available with Dr. Francine Graven.  Thank you.

## 2023-10-08 NOTE — Telephone Encounter (Signed)
Patient has an OV with [name] NP on 11/10/23.

## 2023-11-09 DIAGNOSIS — Z13820 Encounter for screening for osteoporosis: Secondary | ICD-10-CM | POA: Diagnosis not present

## 2023-11-10 ENCOUNTER — Encounter: Payer: Self-pay | Admitting: Nurse Practitioner

## 2023-11-10 ENCOUNTER — Ambulatory Visit: Admitting: Nurse Practitioner

## 2023-11-10 VITALS — BP 124/82 | HR 73 | Ht 65.0 in | Wt 168.2 lb

## 2023-11-10 DIAGNOSIS — J219 Acute bronchiolitis, unspecified: Secondary | ICD-10-CM

## 2023-11-10 NOTE — Progress Notes (Unsigned)
 @Patient  ID: Colleen Montgomery, female    DOB: 1966-03-21, 58 y.o.   MRN: [id number]  Chief Complaint  Patient presents with   Follow-up    Follow ct. Pt is doing well.    Referring provider: Farris Has, MD  HPI: 58 year old female, never smoker followed for abnormal CT chest.  She is a patient of Dr. Lanora Manis and last seen in office 03/16/2023.  Past medical history significant for stage IIb right breast cancer and hypothyroidism.  TEST/EVENTS:  01/01/2023 CT chest: Status postlumpectomy with axillary lymph node dissection on right.  No LAD.  Mild diffuse bilateral bronchial wall thickening and background of very tiny centrilobular pulmonary nodules, mostly concentrated in the lung apices and seen with smoking-related respiratory bronchiolitis. 09/27/2023 HRCT chest: No LAD.  No ILD.  Lungs are clear.  Mild air trapping.  Hepatic steatosis.  03/16/2023: OV with Dr. Francine Graven for consult.  Concern for bronchiolitis on CT imaging.  Denies any cough, wheezing, chest tightness or dyspnea.  Active hiking/walking 2 to 5 miles per day with her dog.  Denies any respiratory issues.  Does not smoke currently.  Has smoked some cigars in the past.  Quit in 2011.  Does burn candles and incense at home.  Only concerning risk factor would be burning candles or incidents in her home.  Recommend that she not use these products and recheck HRCT in 6 months.  11/10/2023: Today - follow up Discussed the use of AI scribe software for clinical note transcription with the patient, who gave verbal consent to proceed.  History of Present Illness   Colleen Montgomery is a 58 year old female who presents for follow-up of a CT scan regarding bronchiolitis. She was referred by an oncology doctor for a CT scan.  She underwent a CT scan initially ordered by an oncology doctor to evaluate bronchiolitis. She did not have any symptoms at the time. She was advised to stop burning incense in the home.   She had a repeat scan  with HRCT chest 09/27/2023. The scan revealed mild air trapping without any other significant changes or concerning findings. She experiences no symptoms such as shortness of breath, wheezing, or cough.  She has a history of bronchitis as a child and was diagnosed with COVID-19 at the end of 2022. Since then, she has not experienced any illness, including at the time of her prior CT scan in May.   She is a never smoker.   She leads an active lifestyle, hiking five miles a day with her two dogs, and reports no respiratory symptoms during these activities. 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       Allergies  Allergen Reactions   Cephalexin Rash    Immunization History  Administered Date(s) Administered   Influenza-Unspecified 05/11/2017   PPD Test 12/11/2019    Past Medical History:  Diagnosis Date   Breast cancer (HCC)    Diverticulosis    Hypothyroidism    Thyroid disease     Tobacco History: Social History   Tobacco Use  Smoking Status Never  Smokeless Tobacco Never   Counseling given: Not Answered   Outpatient Medications Prior to Visit  Medication Sig Dispense Refill   ARMOUR THYROID 60 MG tablet Take 60 mg by mouth daily.     Omega 3 1000 MG CAPS 1 capsule Orally Once a day     Pediatric Multivitamins-Fl (MULTIVITAMIN + FLUORIDE) 0.25 MG CHEW 1 tablet by mouth Once daily (Patient  not taking: Reported on 11/10/2023)     No facility-administered medications prior to visit.     Review of Systems:   Constitutional: No weight loss or gain, night sweats, fevers, chills, fatigue, or lassitude. HEENT: No sneezing, itching, ear ache, nasal congestion, or post nasal drip CV:  No chest pain, orthopnea, PND, swelling in lower extremities, anasarca, dizziness, palpitations, syncope Resp: No shortness of breath with exertion or at rest. No excess mucus or change in color of mucus. No productive or non-productive. No hemoptysis. No wheezing.  GI:  No heartburn,  indigestion MSK:  No joint pain or swelling.   Neuro: No dizziness or lightheadedness.  Psych: No depression or anxiety. Mood stable.     Physical Exam:  BP 124/82 (BP Location: Left Arm, Patient Position: Sitting, Cuff Size: Normal)   Pulse 73   Ht 5\' 5"  (1.651 m)   Wt 168 lb 3.2 oz (76.3 kg)   LMP 12/23/2016 (Approximate)   SpO2 100%   BMI 27.99 kg/m   GEN: Pleasant, interactive, well-appearing; in no acute distress HEENT:  Normocephalic and atraumatic. PERRLA. Sclera white. Nasal turbinates pink, moist and patent bilaterally. No rhinorrhea present. Oropharynx pink and moist, without exudate or edema. No lesions, ulcerations, or postnasal drip.  NECK:  Supple w/ fair ROM. No lymphadenopathy.   CV: RRR, no m/r/g, no peripheral edema. Pulses intact, +2 bilaterally. No cyanosis, pallor or clubbing. PULMONARY:  Unlabored, regular breathing. Clear bilaterally A&P w/o wheezes/rales/rhonchi. No accessory muscle use.  GI: BS present and normoactive. Soft, non-tender to palpation. MSK: No erythema, warmth or tenderness.  Neuro: A/Ox3. No focal deficits noted.   Skin: Warm, no lesions or rashe Psych: Normal affect and behavior. Judgement and thought content appropriate.     Lab Results:  CBC    Component Value Date/Time   WBC 5.5 06/05/2022 1145   RBC 4.30 06/05/2022 1145   HGB 12.4 06/05/2022 1145   HGB 12.6 04/15/2022 1233   HCT 37.6 06/05/2022 1145   PLT 213 06/05/2022 1145   PLT 235 04/15/2022 1233   MCV 87.4 06/05/2022 1145   MCH 28.8 06/05/2022 1145   MCHC 33.0 06/05/2022 1145   RDW 12.7 06/05/2022 1145   LYMPHSABS 1.2 04/15/2022 1233   MONOABS 0.4 04/15/2022 1233   EOSABS 0.1 04/15/2022 1233   BASOSABS 0.0 04/15/2022 1233    BMET    Component Value Date/Time   NA 140 04/15/2022 1233   K 3.9 04/15/2022 1233   CL 106 04/15/2022 1233   CO2 28 04/15/2022 1233   GLUCOSE 83 04/15/2022 1233   BUN 12 04/15/2022 1233   CREATININE 0.92 04/15/2022 1233   CALCIUM 9.9  04/15/2022 1233   GFRNONAA >60 04/15/2022 1233   GFRAA >60 05/27/2019 0506    BNP No results found for: "BNP"   Imaging:  No results found.  Administration History     None           No data to display          No results found for: "NITRICOXIDE"      Assessment & Plan:   Bronchiolitis Never smoker. No active respiratory symptoms. HRCT with clear lungs; mild air trapping. No significant bronchiolitis visualized/mentioned. No dedicated follow up necessary. Advised to notify if new respiratory symptoms develop. Avoid incense in the home and smoke exposure.   Patient Instructions  CT scan looked fine! No dedicated follow up needed   Follow up as needed for any respiratory problems   Advised  if symptoms do not improve or worsen, to please contact office for sooner follow up or seek emergency care.   I spent 25 minutes of dedicated to the care of this patient on the date of this encounter to include pre-visit review of records, face-to-face time with the patient discussing conditions above, post visit ordering of testing, clinical documentation with the electronic health record, making appropriate referrals as documented, and communicating necessary findings to members of the patients care team.  Noemi Chapel, NP 11/12/2023  Pt aware and understands NP's role.

## 2023-11-10 NOTE — Patient Instructions (Signed)
 CT scan looked fine! No dedicated follow up needed   Follow up as needed for any respiratory problems

## 2023-11-12 ENCOUNTER — Encounter: Payer: Self-pay | Admitting: Nurse Practitioner

## 2023-11-12 DIAGNOSIS — J219 Acute bronchiolitis, unspecified: Secondary | ICD-10-CM | POA: Insufficient documentation

## 2023-11-12 NOTE — Assessment & Plan Note (Signed)
 Never smoker. No active respiratory symptoms. HRCT with clear lungs; mild air trapping. No significant bronchiolitis visualized/mentioned. No dedicated follow up necessary. Advised to notify if new respiratory symptoms develop. Avoid incense in the home and smoke exposure.   Patient Instructions  CT scan looked fine! No dedicated follow up needed   Follow up as needed for any respiratory problems

## 2023-12-02 ENCOUNTER — Telehealth: Payer: Self-pay | Admitting: Hematology and Oncology

## 2023-12-02 ENCOUNTER — Ambulatory Visit: Admitting: Hematology and Oncology

## 2023-12-02 NOTE — Telephone Encounter (Signed)
 Lefty vm about rescheduled appt date and time

## 2023-12-23 NOTE — Therapy (Signed)
 OUTPATIENT PHYSICAL THERAPY SOZO SCREENING NOTE   Patient Name: Colleen Montgomery MRN: [id number] DOB:[birth year], 58 y.o., female Today's Date: 12/24/2023  PCP: Ronna Coho, MD REFERRING PROVIDER: Ronna Coho, MD   PT End of Session - 12/24/23 1006     Visit Number 6   unchanged due to screen only   PT Start Time 0806    PT Stop Time 0808    PT Time Calculation (min) 2 min    Activity Tolerance Patient tolerated treatment well    Behavior During Therapy [facility] for tasks assessed/performed             Past Medical History:  Diagnosis Date   Breast cancer (HCC)    Diverticulosis    Hypothyroidism    Thyroid  disease    Past Surgical History:  Procedure Laterality Date   APPENDECTOMY     BREAST LUMPECTOMY Right 06/08/2022   Procedure: RIGHT BREAST LUMPECTOMY;  Surgeon: Oza Blumenthal, MD;  Location: [facility] OR;  Service: General;  Laterality: Right;   BREAST REDUCTION SURGERY Bilateral 06/08/2022   Procedure: BILATERAL ONCOPLASTIC BREAST REDUCTION;  Surgeon: Rodman Clam, MD;  Location: [facility] OR;  Service: Plastics;  Laterality: Bilateral;   ECTOPIC PREGNANCY SURGERY  1996   RADIOACTIVE SEED GUIDED AXILLARY SENTINEL LYMPH NODE Right 06/08/2022   Procedure: RADIOACTIVE SEED GUIDED RIGHT AXILLARY SENTINEL LYMPH NODE DISSECTION;  Surgeon: Oza Blumenthal, MD;  Location: [facility] OR;  Service: General;  Laterality: Right;   Patient Active Problem List   Diagnosis Date Noted   Bronchiolitis 11/12/2023   Genetic testing 04/27/2022   Family history of breast cancer 04/16/2022   Family history of colon cancer 04/16/2022   Family history of prostate cancer in father 04/16/2022   Malignant neoplasm of upper-outer quadrant of right breast in female, estrogen receptor positive (HCC) 04/13/2022   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 It measures 1.9 cm and is located in the upper outer quadrant. It is ER positive, PR negative and HER2 negative with a Ki67 of 60%. She has a positive axillary lymph node. Had a Rt lumpectomy with bil reduction on 06/08/22.  1/2 positive lymph nodes.   PRECAUTIONS: right UE Lymphedema risk,   SUBJECTIVE:   PAIN:  Are you having pain? no  SOZO SCREENING: Patient was assessed today using the SOZO machine to determine the lymphedema index score. This was compared to her baseline score. It was determined that she is within the recommended range when compared to her baseline and no further action is needed at this time. She will continue SOZO screenings. These are done every 3 months for 2 years post operatively followed by every 6 months for 2 years, and then annually.    L-DEX FLOWSHEETS - 12/24/23 1000       L-DEX LYMPHEDEMA SCREENING   Measurement Type Unilateral    L-DEX MEASUREMENT EXTREMITY Upper Extremity    POSITION  Standing    DOMINANT SIDE Right    At Risk Side Right    BASELINE SCORE (UNILATERAL) 0.5    L-DEX SCORE (UNILATERAL) -0.3  VALUE CHANGE (UNILAT) -0.8           Weight 160   Cathaleen Clinton Corley, PT 12/24/2023, 10:08 AM

## 2023-12-24 ENCOUNTER — Ambulatory Visit: Payer: 59 | Attending: Surgery

## 2023-12-24 DIAGNOSIS — Z483 Aftercare following surgery for neoplasm: Secondary | ICD-10-CM | POA: Insufficient documentation

## 2024-01-31 ENCOUNTER — Inpatient Hospital Stay: Admitting: Hematology and Oncology

## 2024-01-31 NOTE — Assessment & Plan Note (Deleted)
 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 Given the positive lymph node and the high risk Oncotype, I recommended doing neoadjuvant chemotherapy.   Treatment plan: 1.  Neoadjuvant chemotherapy with Taxol carbo Keytruda followed by Adriamycin Cytoxan Keytruda followed by [facility] maintenance for 1 year. 2. breast conserving surgery with targeted node dissection 3.  Adjuvant radiation 4. +/- Antiestrogens (based upon final pathology) -------------------------------------------------------------------------------------------------------- Patient wanted to pursue holistic treatment options.   Right lumpectomy 06/08/2022: Grade 3 IDC 5.5 cm, 1/2 lymph nodes positive focal extracapsular extension is present ER 60% weak to moderate, PR 0%, HER2 negative, Ki-67 60%   She is following with holistic doctors who have performed Onconomics analysis on circulating tumor cells for drug sensitivity.   Treatment plan: Patient was not interested in adjuvant chemotherapy. Adjuvant radiation 09/02/2022-09/21/2022 (patient decided to stop radiation early) breast prognostic panel ER 0%, PR 0%, Ki-67 50%, HER2 1+ negative   Breast cancer surveillance:  Breast exam 01/31/2024: Benign Annual mammograms will need to be done   Abdominal pain and back pain: CT CAP 01/02/2023: No evidence of metastatic disease.  Mild bilateral upper lung bronchial wall thickening (bronchiolitis)  10/11/2023: CT chest: No evidence of interstitial lung disease, hepatic steatosis   return to clinic in 1 year for surveillance and follow-up.

## 2024-02-21 NOTE — Assessment & Plan Note (Signed)
 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 Given the positive lymph node and the high risk Oncotype, I recommended doing neoadjuvant chemotherapy.   Treatment plan: 1.  Neoadjuvant chemotherapy with Taxol carbo Keytruda followed by Adriamycin Cytoxan Keytruda followed by [facility] maintenance for 1 year. 2. breast conserving surgery with targeted node dissection 3.  Adjuvant radiation 4. +/- Antiestrogens (based upon final pathology) -------------------------------------------------------------------------------------------------------- Patient wanted to pursue holistic treatment options.   Right lumpectomy 06/08/2022: Grade 3 IDC 5.5 cm, 1/2 lymph nodes positive focal extracapsular extension is present ER 60% weak to moderate, PR 0%, HER2 negative, Ki-67 60%   She is following with holistic doctors who have performed Onconomics analysis on circulating tumor cells for drug sensitivity.   Treatment plan: Patient was not interested in adjuvant chemotherapy. Adjuvant radiation 09/02/2022-09/21/2022 (patient decided to stop radiation early) breast prognostic panel ER 0%, PR 0%, Ki-67 50%, HER2 1+ negative   Breast cancer surveillance: Recommended annual mammograms to be done in August 2024.   Abdominal pain and back pain: CT CAP 01/02/2023: No evidence of metastatic disease.  Mild bilateral upper lung bronchial wall thickening (bronchiolitis)    CT Chest: 10/11/23: No evidence of interstitial lung disease, Hep steatosis return to clinic in 1 year for surveillance and follow-up.

## 2024-02-22 ENCOUNTER — Inpatient Hospital Stay: Attending: Hematology and Oncology | Admitting: Hematology and Oncology

## 2024-02-22 VITALS — BP 128/73 | HR 93 | Temp 98.9°F | Resp 18 | Ht 65.0 in | Wt 166.7 lb

## 2024-02-22 DIAGNOSIS — Z853 Personal history of malignant neoplasm of breast: Secondary | ICD-10-CM | POA: Insufficient documentation

## 2024-02-22 DIAGNOSIS — C50411 Malignant neoplasm of upper-outer quadrant of right female breast: Secondary | ICD-10-CM | POA: Diagnosis not present

## 2024-02-22 DIAGNOSIS — Z170 Estrogen receptor positive status [ER+]: Secondary | ICD-10-CM | POA: Diagnosis not present

## 2024-02-22 DIAGNOSIS — Z9221 Personal history of antineoplastic chemotherapy: Secondary | ICD-10-CM | POA: Insufficient documentation

## 2024-02-22 DIAGNOSIS — Z923 Personal history of irradiation: Secondary | ICD-10-CM | POA: Insufficient documentation

## 2024-02-22 NOTE — Progress Notes (Signed)
 Patient Care Team: Kip Righter, MD as PCP - General (Family Medicine) Glean Stephane BROCKS, RN (Inactive) as Oncology Nurse Navigator Tyree Nanetta SAILOR, RN as Oncology Nurse Navigator Vernetta Berg, MD as Consulting Physician (General Surgery) Odean Potts, MD as Consulting Physician (Hematology and Oncology) Shannon Agent, MD as Consulting Physician (Radiation Oncology)  DIAGNOSIS:  Encounter Diagnosis  Name Primary?   Malignant neoplasm of upper-outer quadrant of right breast in female, estrogen receptor positive (HCC) Yes    SUMMARY OF ONCOLOGIC HISTORY: Oncology History  Malignant neoplasm of upper-outer quadrant of right breast in female, estrogen receptor positive (HCC)  04/03/2022 Initial Diagnosis   Palpable right breast mass UOQ, by ultrasound at 11 o'clock position 1.9 cm mass which on biopsy was grade 3 IDC ER 60%, PR 0%, Ki-67 60%, HER2 negative, 1 lymph node in the axilla: Biopsy positive   04/15/2022 Cancer Staging   Staging form: Breast, AJCC 8th Edition - Clinical: Stage IIB (cT1c, cN1, cM0, G3, ER+, PR-, HER2-) - Signed by Odean Potts, MD on 04/15/2022 Stage prefix: Initial diagnosis Histologic grading system: 3 grade system    Genetic Testing   [facility] CancerNext-Expanded Panel was Negative. 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 Report date is 04/24/2022.  The CancerNext-Expanded gene panel offered by [facility] and includes sequencing, rearrangement, and RNA analysis for the following 77 genes: AIP, ALK, APC, ATM, AXIN2, BAP1, BARD1, BLM, BMPR1A, BRCA1, BRCA2, BRIP1, CDC73, CDH1, CDK4, CDKN1B, CDKN2A, CHEK2, CTNNA1, DICER1, FANCC, FH, FLCN, GALNT12, KIF1B, LZTR1, MAX, MEN1, MET, MLH1, MSH2, MSH3, MSH6, MUTYH, NBN, NF1, NF2, NTHL1, PALB2, PHOX2B, PMS2, POT1, PRKAR1A, PTCH1, PTEN, RAD51C, RAD51D, RB1, RECQL, RET, SDHA, SDHAF2, SDHB, SDHC, SDHD, SMAD4, SMARCA4, SMARCB1, SMARCE1, STK11, SUFU, TMEM127, TP53, TSC1,  TSC2, VHL and XRCC2 (sequencing and deletion/duplication); EGFR, EGLN1, HOXB13, KIT, MITF, PDGFRA, POLD1, and POLE (sequencing only); EPCAM and GREM1 (deletion/duplication only).      CHIEF COMPLIANT: Surveillance of breast cancer  HISTORY OF PRESENT ILLNESS:  History of Present Illness Colleen Montgomery is a 58 year old female with a history of breast cancer who presents for follow-up on her cancer treatment and monitoring.  She is undergoing a holistic treatment plan for breast cancer, including high-dose vitamin C therapy and regular monitoring of cancer markers. Her latest [facility] report shows a decrease in circulating tumor cells from 4% to 2.3%. She attends appointments every three months for lab work to monitor cancer markers and other health parameters.  A previous CT scan showed inflammation in the apex of her lung, which resolved after she stopped burning incense. A follow-up CT scan confirmed the resolution of the inflammation.  She has made significant dietary changes and feels better than ever. She plans to schedule a mammogram with her regular provider, [facility], where she also receives bone density tests and annual exams.     ALLERGIES:  is allergic to cephalexin .  MEDICATIONS:  Current Outpatient Medications  Medication Sig Dispense Refill   ARMOUR THYROID  60 MG tablet Take 60 mg by mouth daily.     Omega 3 1000 MG CAPS 1 capsule Orally Once a day     No current facility-administered medications for this visit.    PHYSICAL EXAMINATION: ECOG PERFORMANCE STATUS: 1 - Symptomatic but completely ambulatory  Vitals:   02/22/24 1500  BP: 128/73  Pulse: 93  Resp: 18  Temp: 98.9 F (37.2 C)  SpO2: 98%   Filed Weights   02/22/24 1500  Weight: 166 lb 11.2 oz (75.6 kg)    LABORATORY DATA:  I have reviewed the data as listed    Latest Ref Rng & Units 04/15/2022   12:33 PM 05/27/2019    5:06 AM  CMP  Glucose 70 - 99 mg/dL 83  897   BUN 6 - 20 mg/dL 12   21   Creatinine 9.55 - 1.00 mg/dL 9.07  8.87   Sodium 864 - 145 mmol/L 140  140   Potassium 3.5 - 5.1 mmol/L 3.9  3.6   Chloride 98 - 111 mmol/L 106  106   CO2 22 - 32 mmol/L 28  25   Calcium 8.9 - 10.3 mg/dL 9.9  9.1   Total Protein 6.5 - 8.1 g/dL 7.1  7.4   Total Bilirubin 0.3 - 1.2 mg/dL 0.5  0.6   Alkaline Phos 38 - 126 U/L 61  69   AST 15 - 41 U/L 18  19   ALT 0 - 44 U/L 16  19     Lab Results  Component Value Date   WBC 5.5 06/05/2022   HGB 12.4 06/05/2022   HCT 37.6 06/05/2022   MCV 87.4 06/05/2022   PLT 213 06/05/2022   NEUTROABS 2.9 04/15/2022    ASSESSMENT & PLAN:  Malignant neoplasm of upper-outer quadrant of right breast in female, estrogen receptor positive (HCC) 04/03/2022:Palpable right breast mass UOQ, by ultrasound at 11 o'clock position 1.9 cm mass which on biopsy was grade 3 IDC ER 60%, PR 0%, Ki-67 60%, HER2 negative, 1 lymph node in the axilla: Biopsy positive.   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 Given the positive lymph node and the high risk Oncotype, I recommended doing neoadjuvant chemotherapy.   Treatment plan: 1.  Neoadjuvant chemotherapy with Taxol carbo Keytruda followed by Adriamycin Cytoxan Keytruda followed by Sharion maintenance for 1 year. 2. Right lumpectomy 06/08/2022: Grade 3 IDC 5.5 cm, 1/2 lymph nodes positive focal extracapsular extension is present ER 60% weak to moderate, PR 0%, HER2 negative, Ki-67 60% 3.  Adjuvant radiation 09/02/2022-09/21/2022 (patient decided to stop radiation early) No role of antiestrogen therapy since the final pathology is ER 0% PR 0% -------------------------------------------------------------------------------------------------------- Patient wanted to pursue holistic treatment options. She is following with holistic doctors who have performed [employer] analysis on circulating tumor cells for  drug sensitivity.    Breast cancer surveillance: Recommended [facility]   Abdominal pain and back pain: CT CAP 01/02/2023: No evidence of metastatic disease.  Mild bilateral upper lung bronchial wall thickening (bronchiolitis)  If her cancer markers increase (being done by her holistic physician Dr. Landy), we will then order scans.   CT Chest: 10/11/23: No evidence of interstitial lung disease, Hep steatosis return to clinic in 1 year for surveillance and follow-up. Assessment & Plan Malignant neoplasm of upper-outer quadrant of right breast, estrogen receptor positive Estrogen receptor-positive neoplasm with decreased circulating tumor cells from 4% to 2.3%. Under holistic care with improved well-being. No immediate intervention required. - Continue monitoring cancer markers and health with Dr. Landy. - Encourage annual mammograms at [facility]. - Order CT scan if cancer markers increase. - Maintain current holistic therapies and dietary changes.      No orders of the defined types were placed in this encounter.  The patient has a good understanding of the overall plan. she agrees with it.  she will call with any problems that may develop before the next visit here. Total time spent: 30 mins including face to face time and time spent for planning, charting and co-ordination of care   Colleen MARLA Chad, MD 02/22/24

## 2024-03-24 ENCOUNTER — Ambulatory Visit: Attending: Surgery

## 2024-03-24 DIAGNOSIS — Z483 Aftercare following surgery for neoplasm: Secondary | ICD-10-CM | POA: Insufficient documentation

## 2024-05-12 ENCOUNTER — Ambulatory Visit: Attending: Surgery

## 2024-05-12 DIAGNOSIS — Z483 Aftercare following surgery for neoplasm: Secondary | ICD-10-CM | POA: Insufficient documentation

## 2024-05-12 NOTE — Therapy (Signed)
  OUTPATIENT PHYSICAL THERAPY SOZO SCREENING NOTE   Patient Name: Colleen Montgomery MRN: [id number] DOB:01-May-1966, 58 y.o., female Today's Date: 05/12/2024  PCP: Kip Righter, MD REFERRING PROVIDER: Vernetta Berg, MD   PT End of Session - 05/12/24 1311     Visit Number 6   due to screen only   Number of Visits 12    Date for Recertification  08/19/22    PT Start Time 0800    PT Stop Time 0806    PT Time Calculation (min) 6 min    Activity Tolerance Patient tolerated treatment well    Behavior During Therapy [facility] for tasks assessed/performed          Past Medical History:  Diagnosis Date   Breast cancer (HCC)    Diverticulosis    Hypothyroidism    Thyroid  disease    Past Surgical History:  Procedure Laterality Date   APPENDECTOMY     BREAST LUMPECTOMY Right 06/08/2022   Procedure: RIGHT BREAST LUMPECTOMY;  Surgeon: Vernetta Berg, MD;  Location: [facility] OR;  Service: General;  Laterality: Right;   BREAST REDUCTION SURGERY Bilateral 06/08/2022   Procedure: BILATERAL ONCOPLASTIC BREAST REDUCTION;  Surgeon: Marene Sieving, MD;  Location: [facility] OR;  Service: Plastics;  Laterality: Bilateral;   ECTOPIC PREGNANCY SURGERY  1996   RADIOACTIVE SEED GUIDED AXILLARY SENTINEL LYMPH NODE Right 06/08/2022   Procedure: RADIOACTIVE SEED GUIDED RIGHT AXILLARY SENTINEL LYMPH NODE DISSECTION;  Surgeon: Vernetta Berg, MD;  Location: [facility] OR;  Service: General;  Laterality: Right;   Patient Active Problem List   Diagnosis Date Noted   Bronchiolitis 11/12/2023   Genetic testing 04/27/2022   Family history of breast cancer 04/16/2022   Family history of colon cancer 04/16/2022   Family history of prostate cancer in father 04/16/2022   Malignant neoplasm of upper-outer quadrant of right breast in female, estrogen receptor positive (HCC) 04/13/2022   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 It measures 1.9 cm and is located in the upper outer quadrant. It is ER positive, PR negative and HER2 negative with a Ki67 of 60%. She has a positive axillary lymph node. Had a Rt lumpectomy with bil reduction on 06/08/22.  1/2 positive lymph nodes.   PRECAUTIONS: right UE Lymphedema risk,   SUBJECTIVE:   PAIN:  Are you having pain? no  SOZO SCREENING: Patient was assessed today using the SOZO machine to determine the lymphedema index score. This was compared to her baseline score. It was determined that she is within the recommended range when compared to her baseline and no further action is needed at this time. She will continue SOZO screenings. These are done every 3 months for 2 years post operatively followed by every 6 months for 2 years, and then annually.       Grayce JINNY Sheldon, PT 05/12/2024, 1:13 PM

## 2024-11-10 ENCOUNTER — Ambulatory Visit: Attending: Surgery

## 2025-02-22 ENCOUNTER — Ambulatory Visit: Admitting: Hematology and Oncology
